# Patient Record
Sex: Female | Born: 1980 | Race: Black or African American | Hispanic: No | Marital: Single | Smoking: Former smoker
Health system: Southern US, Community
[De-identification: ages and names within clinical notes are randomized; demographics above are authoritative.]

## PROBLEM LIST (undated history)

## (undated) DIAGNOSIS — F32A Depression, unspecified: Secondary | ICD-10-CM

## (undated) DIAGNOSIS — M1990 Unspecified osteoarthritis, unspecified site: Secondary | ICD-10-CM

## (undated) DIAGNOSIS — I10 Essential (primary) hypertension: Secondary | ICD-10-CM

## (undated) DIAGNOSIS — F419 Anxiety disorder, unspecified: Secondary | ICD-10-CM

## (undated) HISTORY — DX: Unspecified osteoarthritis, unspecified site: M19.90

## (undated) HISTORY — DX: Depression, unspecified: F32.A

## (undated) HISTORY — DX: Anxiety disorder, unspecified: F41.9

## (undated) HISTORY — DX: Essential (primary) hypertension: I10

---

## 2010-09-19 ENCOUNTER — Emergency Department: Payer: Self-pay | Admitting: Internal Medicine

## 2010-11-18 ENCOUNTER — Inpatient Hospital Stay: Payer: Self-pay

## 2011-01-02 ENCOUNTER — Emergency Department: Payer: Self-pay | Admitting: Unknown Physician Specialty

## 2013-05-09 ENCOUNTER — Emergency Department: Payer: Self-pay | Admitting: Emergency Medicine

## 2013-05-09 LAB — CBC
HCT: 37.7 % (ref 35.0–47.0)
MCV: 88 fL (ref 80–100)
RBC: 4.27 10*6/uL (ref 3.80–5.20)
RDW: 12.8 % (ref 11.5–14.5)

## 2013-05-09 LAB — BASIC METABOLIC PANEL
Anion Gap: 5 — ABNORMAL LOW (ref 7–16)
BUN: 14 mg/dL (ref 7–18)
Chloride: 105 mmol/L (ref 98–107)
Co2: 26 mmol/L (ref 21–32)
Creatinine: 0.87 mg/dL (ref 0.60–1.30)
EGFR (African American): >60
Glucose: 103 mg/dL — ABNORMAL HIGH (ref 65–99)
Osmolality: 273 (ref 275–301)
Potassium: 3.4 mmol/L — ABNORMAL LOW (ref 3.5–5.1)
Sodium: 136 mmol/L (ref 136–145)

## 2013-05-09 LAB — TROPONIN I: Troponin-I: <0.02 ng/mL

## 2013-12-22 IMAGING — CT CT CHEST W/ CM
1 series · 15 of 32 positions shown, 19 images · IV contrast (APPLIED)
Comparison: none

REASON FOR EXAM: Chest Pain
COMMENTS:

PROCEDURE:     CT  - CT CHEST WITH CONTRAST  - May 09, 2013  [DATE]
RESULT:     History: Chest pain.
Comparison Study: No prior.
TECHNIQUE: Standard CT of the chest performed with 70 cc of [id number].
Evaluation in 3 dimensions on separate workstation performed.

[Series 4: soft tissue · axial · 0.65mm/px · z∈[-497,-239]mm · 15 of 97 slices shown, 19 images]
[im 7/97  soft-tissue]
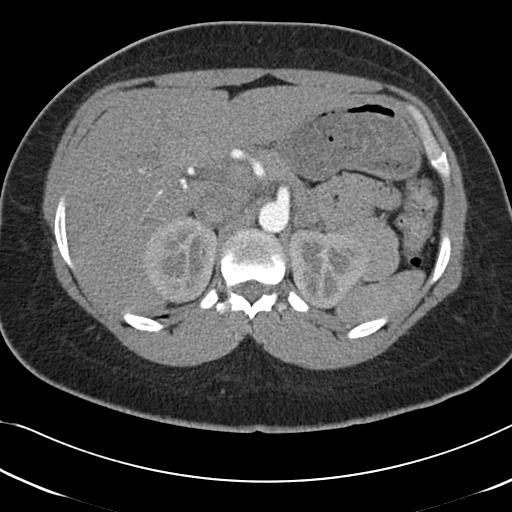
[im 7/97  bone]
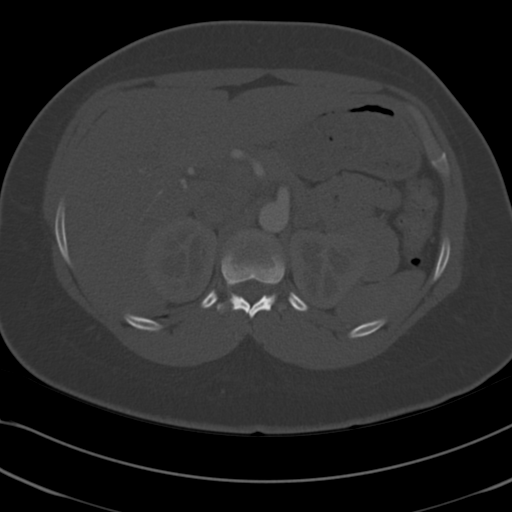
[im 13/97  soft-tissue]
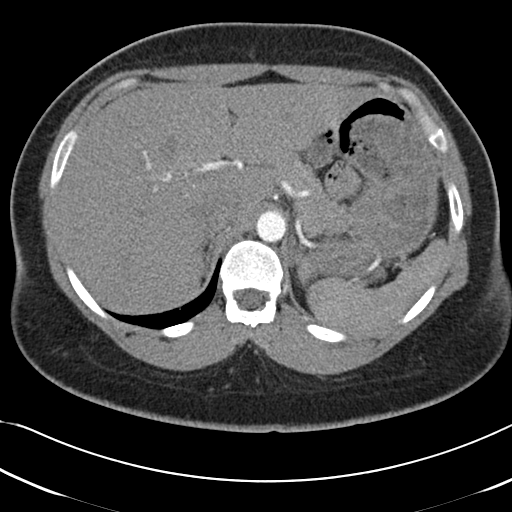
[im 19/97  soft-tissue]
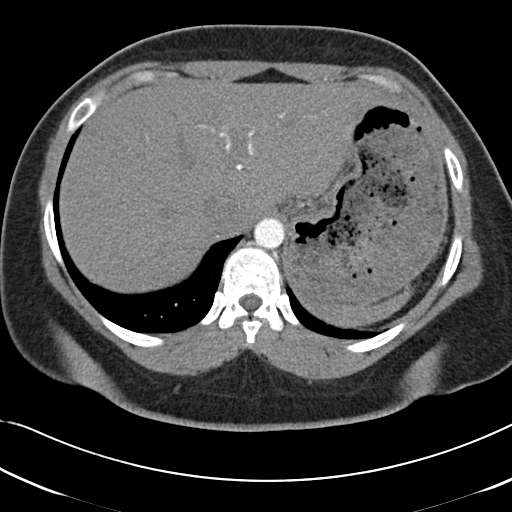
[im 28/97  soft-tissue]
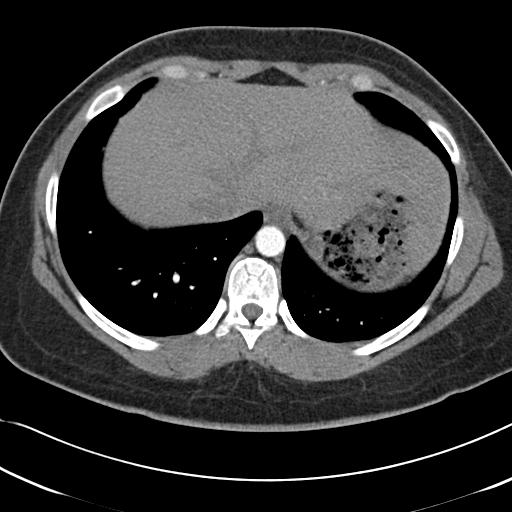
[im 35/97  soft-tissue]
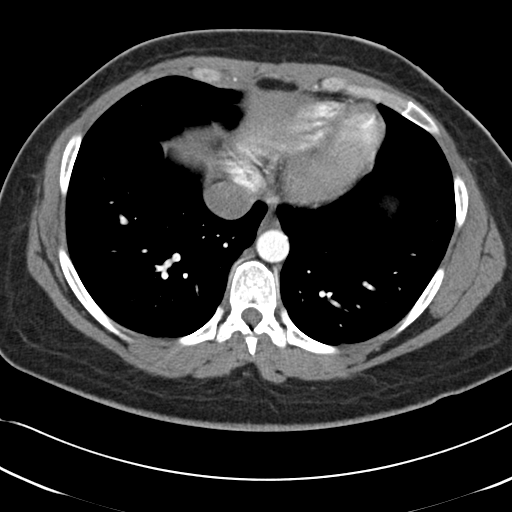
[im 41/97  soft-tissue]
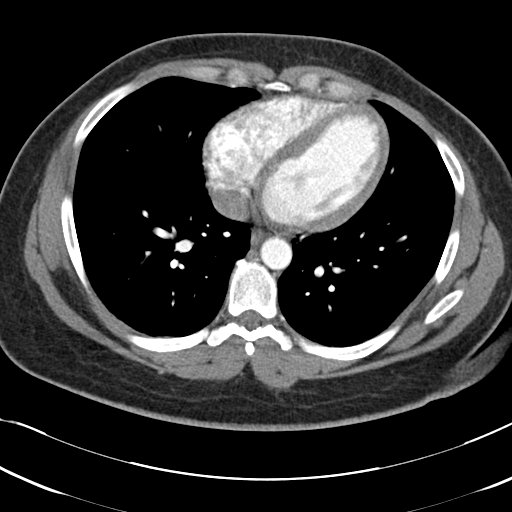
[im 50/97  soft-tissue]
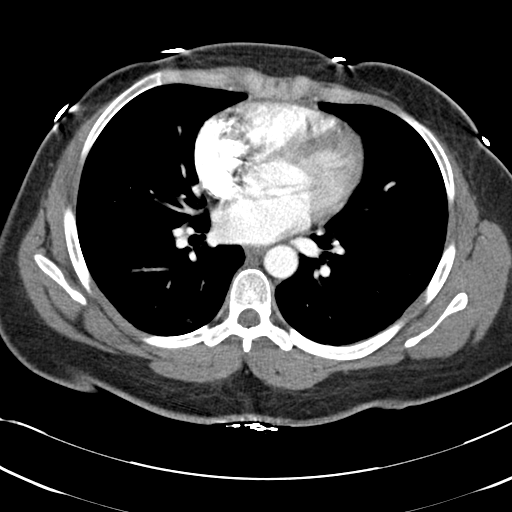
[im 56/97  soft-tissue]
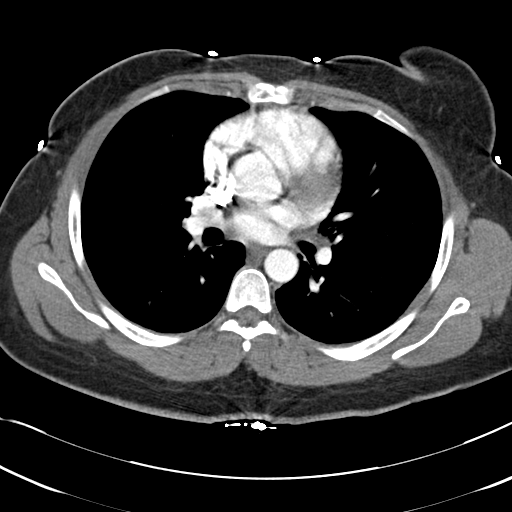
[im 62/97  soft-tissue]
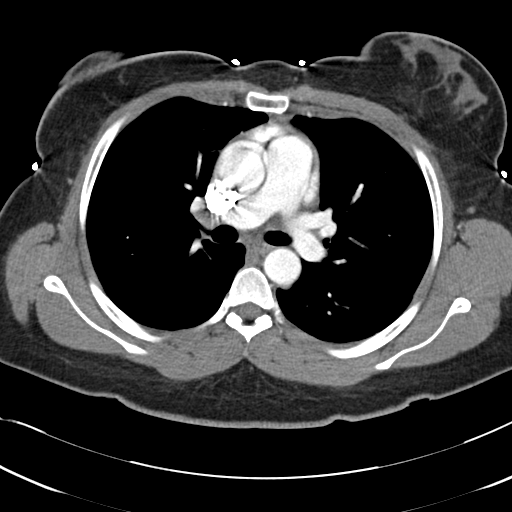
[im 62/97  bone]
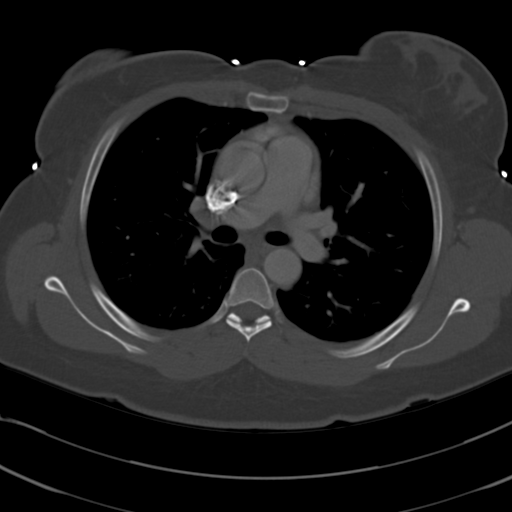
[im 69/97  soft-tissue]
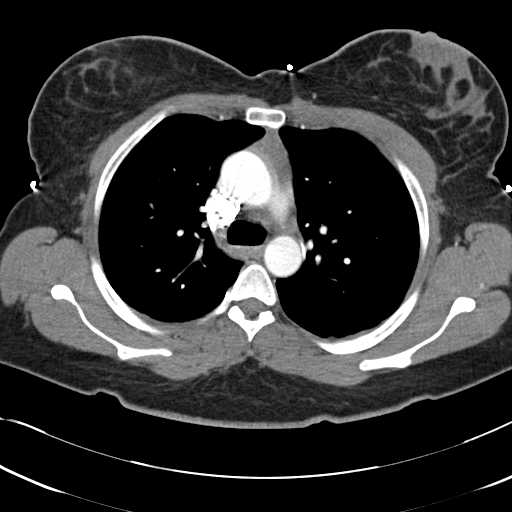
[im 78/97  soft-tissue]
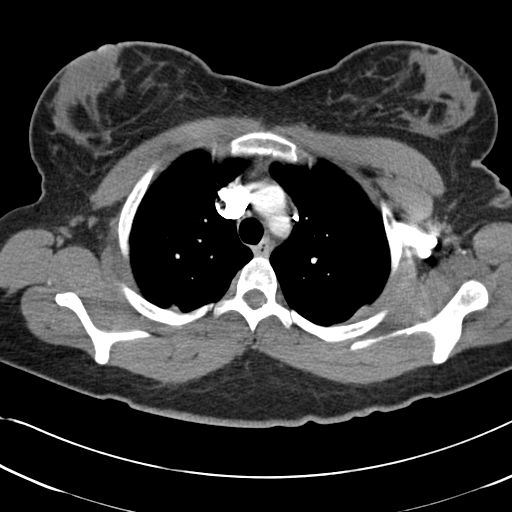
[im 84/97  soft-tissue]
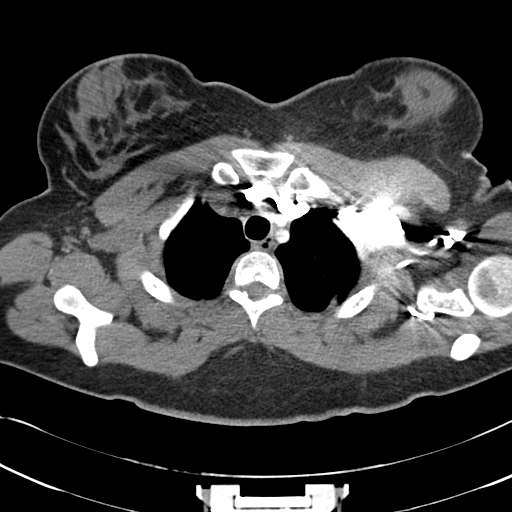
[im 84/97  lung]
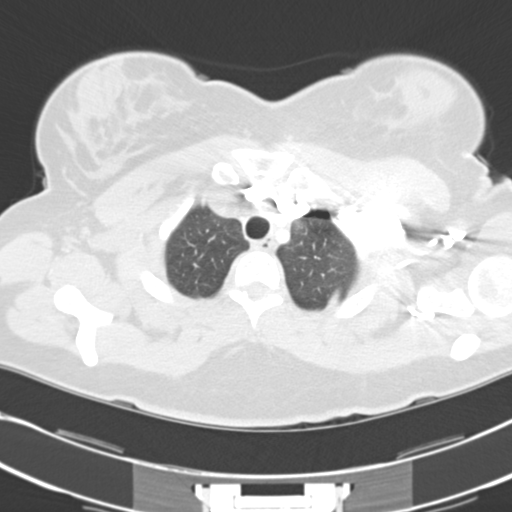
[im 87/97  lung]
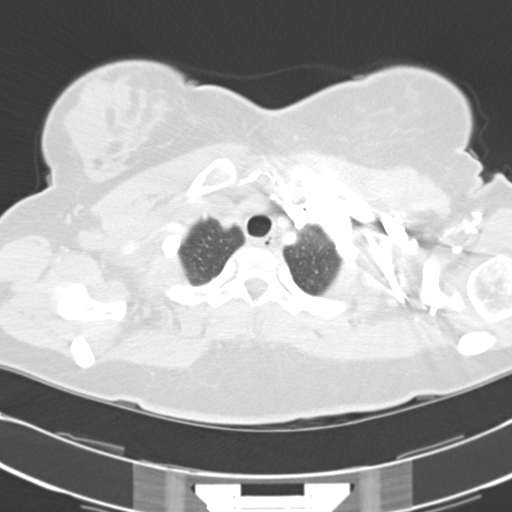
[im 90/97  soft-tissue]
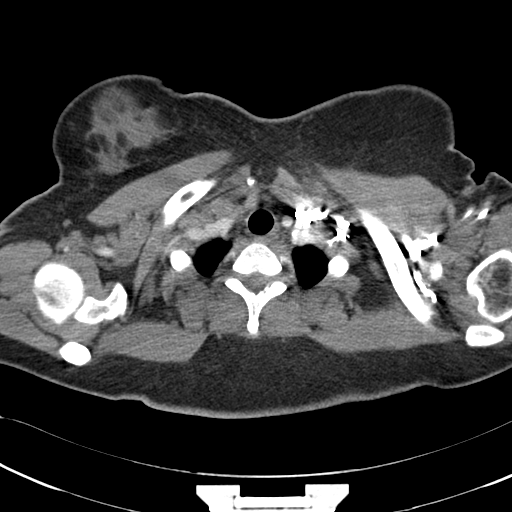
[im 90/97  lung]
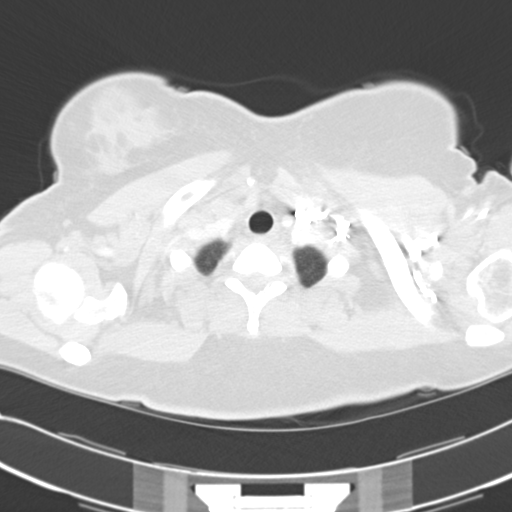
[im 93/97  lung]
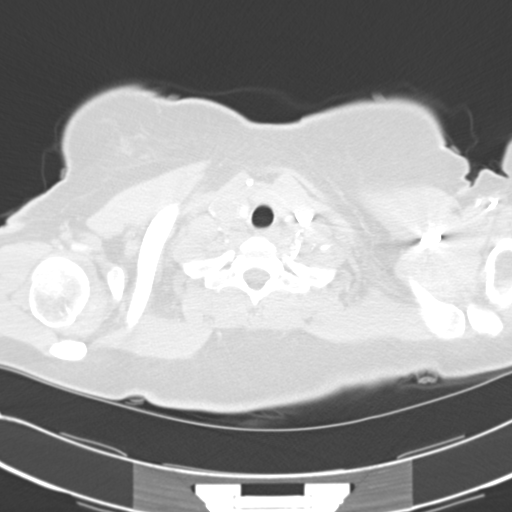

[15 of 32 positions shown; findings below may reference images not displayed]

FINDINGS: Mediastinum and hilar structures are normal. Thoracic aorta normal
caliber. Heart size normal. Pulmonary arteries normal. Large airways patent.
Multiple nonspecific pulmonary nodules are noted in both lungs, the largest
measures 5 mm and is in the right lower lobe (image 48 lung windows) .
Followup chest CTs will be needed to follow these nonspecific tiny nodules.
Large airways are patent. Adrenals are normal. No acute bony abnormality.

Noted in the right internal jugular vein is what appears to be thrombus.
This could be an normal flow defect however color flow duplex Doppler is
suggest for further evaluation. Report phoned to the emergency room at time
of study.
IMPRESSION: 1. Cannot rule out thrombus in the right internal jugular vein possibly
upper superior vena cava. This could represent flow defect. Color flow
duplex Doppler suggested for further evaluation.
2. Tiny nonspecific bilateral pulmonary nodules for which followup chest CT
a 6 month intervals for 2 years suggested.

## 2013-12-22 IMAGING — CR DG CHEST 2V
1 series · 2 of 2 positions shown · non-contrast
Comparison: none

REASON FOR EXAM: Chest Pain
COMMENTS:

PROCEDURE:     DXR - DXR CHEST PA (OR AP) AND LATERAL  - May 09, 2013  [DATE]
RESULT:     Comparison made to prior chest CT of 05/09/2013. Lungs are clear.
Heart size normal.

[Series 1: pa · 0.17mm/px · 2 of 2 slices shown]
[im 1/2]
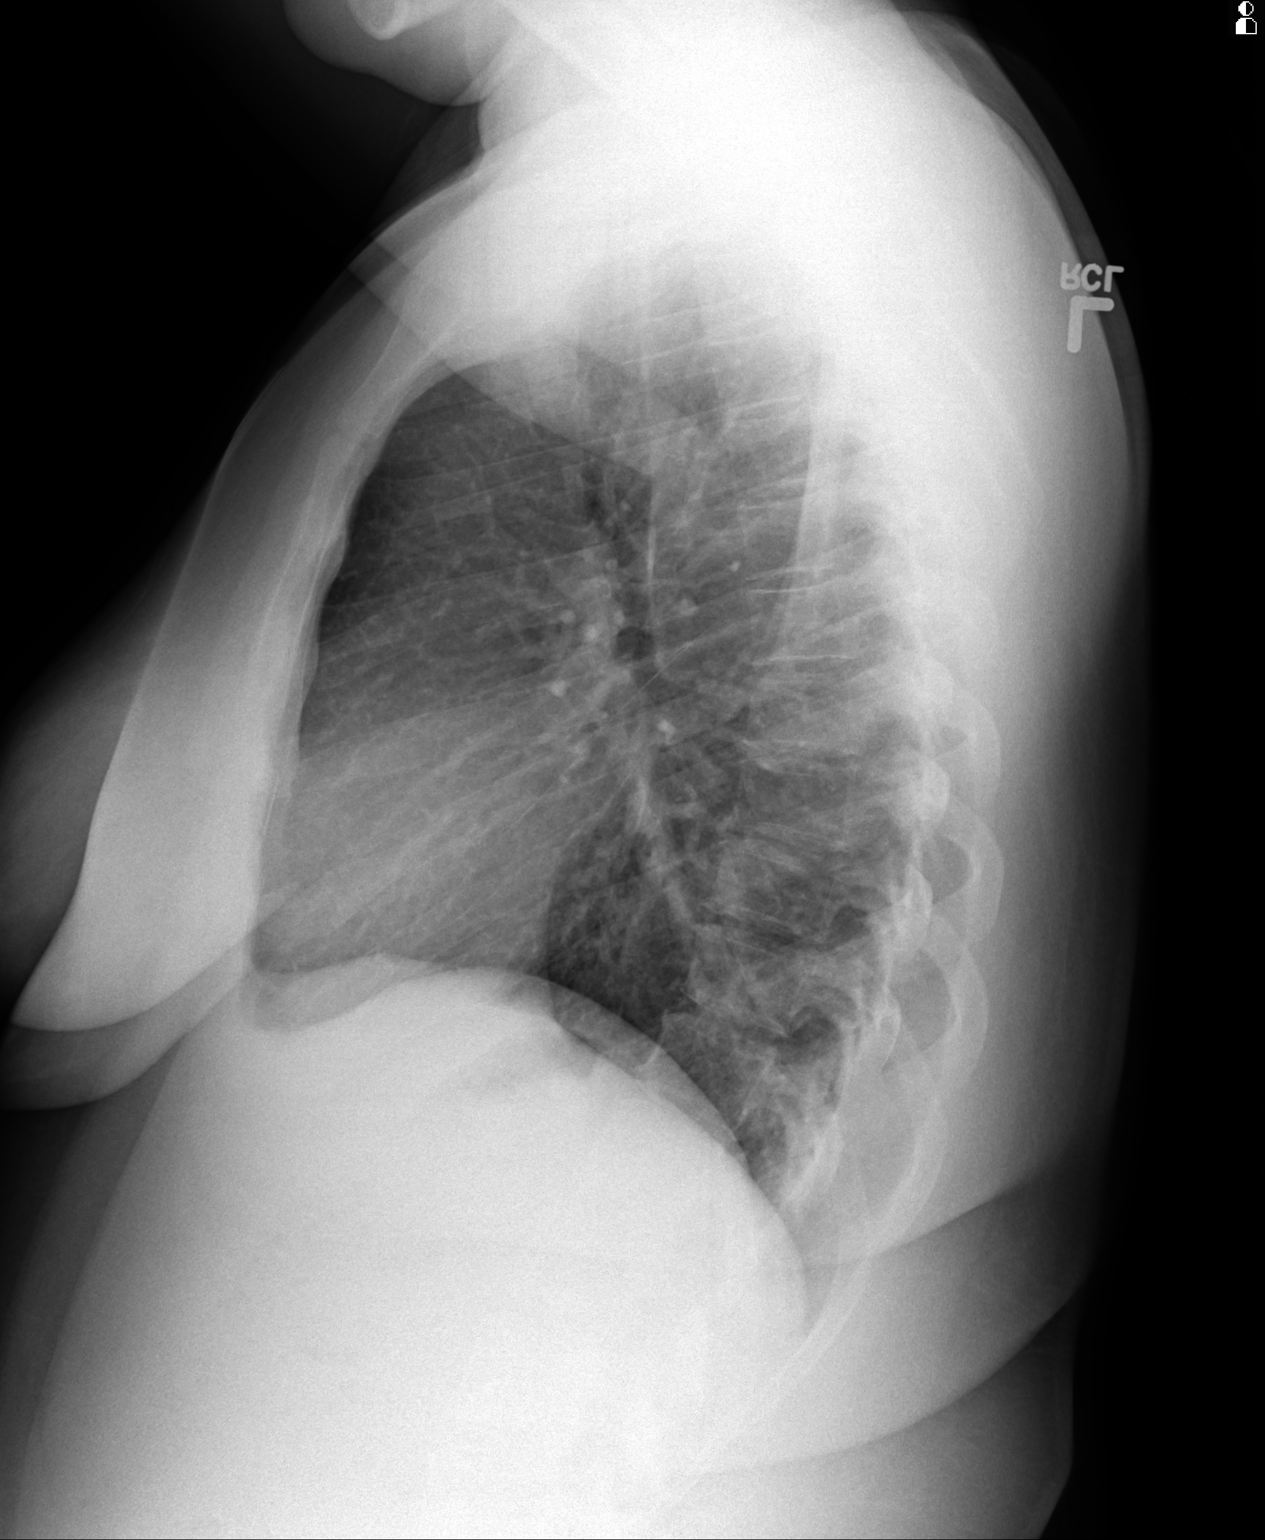
[im 2/2]
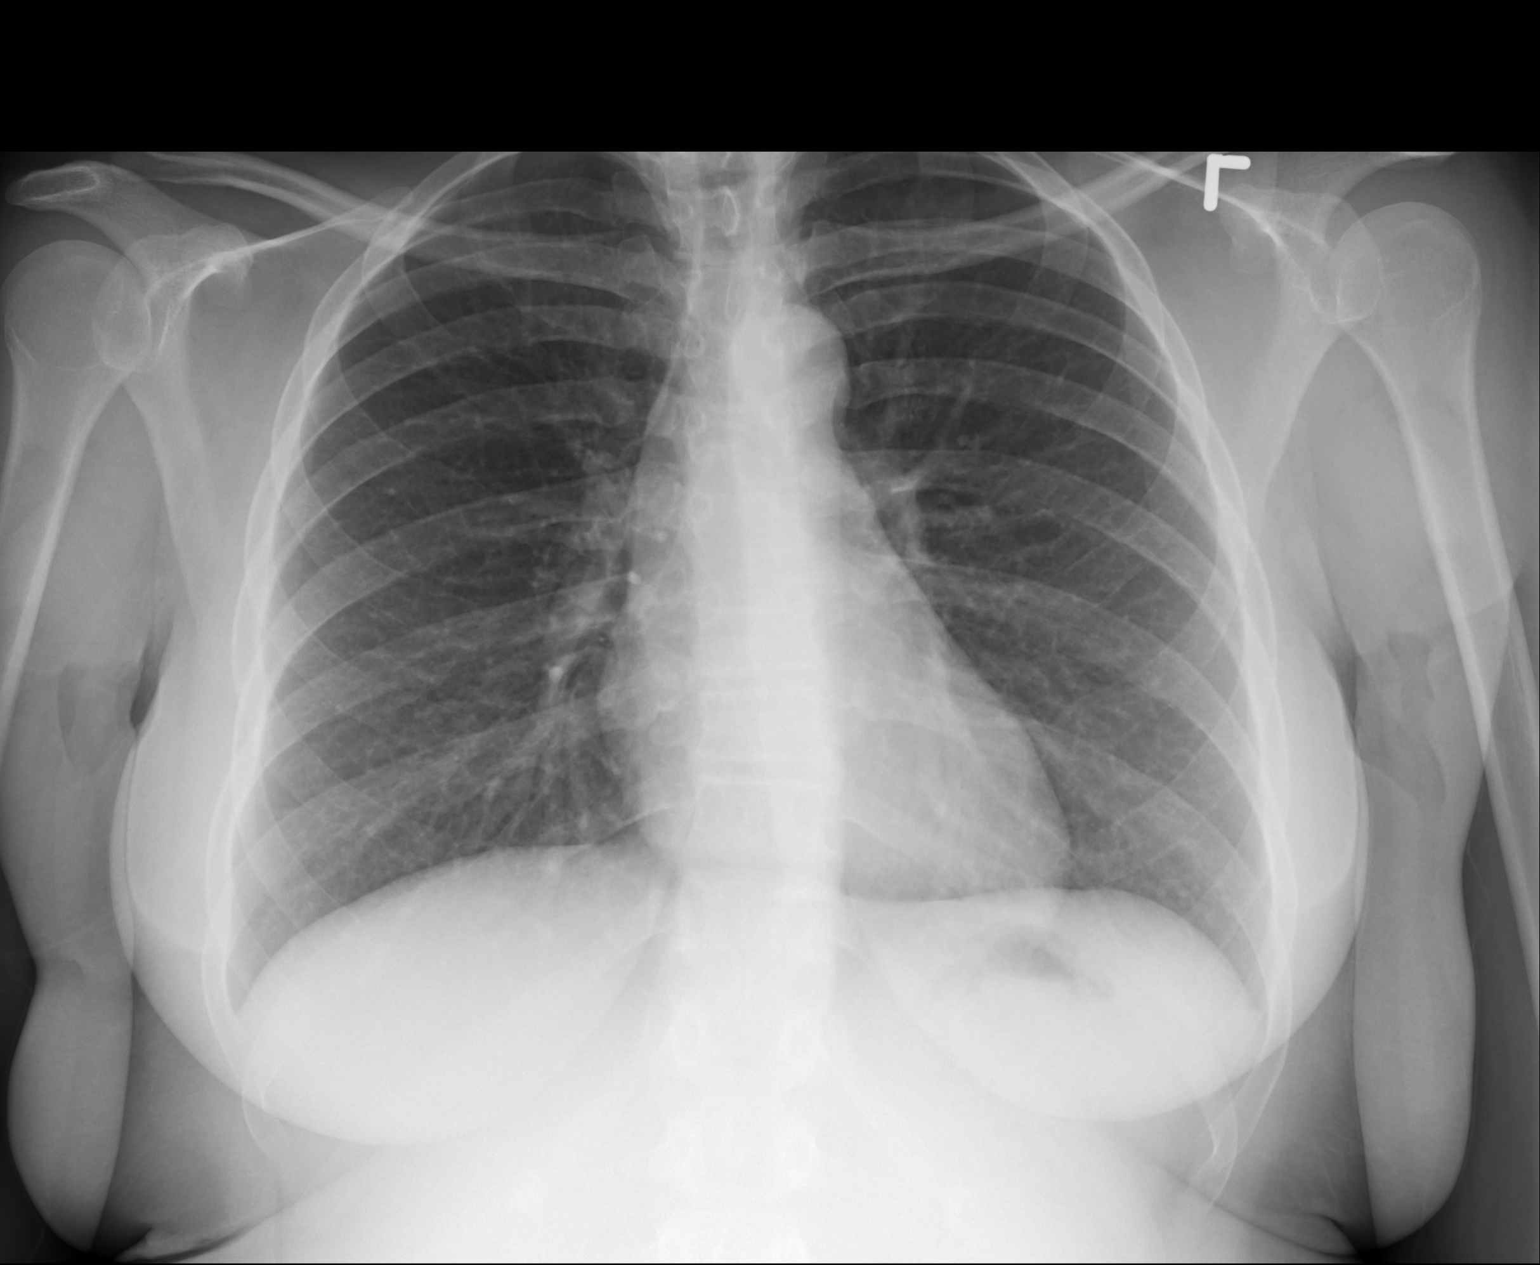

[2 of 2 positions shown; findings below may reference images not displayed]

IMPRESSION: No acute abnormality.

## 2014-10-13 NOTE — L&D Delivery Note (Addendum)
Delivery Summary for Kendra Patel  Labor Events:   Preterm labor:   Rupture date:   Rupture time:   Rupture type: Artificial  Fluid Color: Clear  Induction:   Augmentation:   Complications:   Cervical ripening:          Delivery:   Episiotomy:   Lacerations:   Repair suture:   Repair # of packets:   Blood loss (ml):    Information for the patient's newborn:  Chessie, Neuharth [[id number]]    Delivery 05/08/2015 4:10 PM by  VBAC, Vacuum Assisted Sex:  female Gestational Age: [redacted]w[redacted]d Delivery Clinician:  Hildred Laser Living?: Yes        APGARS  One minute Five minutes Ten minutes  Skin color: 1   1      Heart rate: 2   2      Grimace: 2   2      Muscle tone: 2   2      Breathing: 2   2      Totals: 9  9      Presentation/position: Vertex     Resuscitation:   Cord information:    Disposition of cord blood:     Blood gases sent?  Complications:   Placenta: Delivered: 05/08/2015 4:23 PM     appearance Newborn Measurements: Weight: 7 lb 15 oz (3600 g)  Height: 21.06"  Head circumference: 32 cm  Chest circumference: 32 cm  Other providers: Registered Nurse Transition RN Francisco Capuchin  Additional  information: Forceps:   Vacuum:   Breech:   Observed anomalies        Delivery Summary for Kendra Patel  Labor Events:   Preterm labor:   Rupture date:   Rupture time:   Rupture type: Artificial  Fluid Color: Clear  Induction:   Augmentation:   Complications:   Cervical ripening:          Delivery:   Episiotomy:   Lacerations:   Repair suture:   Repair # of packets:   Blood loss (ml): 200 ml   Information for the patient's newborn:  Hanifa, Antonetti [[id number]]    Delivery 05/08/2015 4:10 PM by  VBAC, Vacuum Assisted Sex:  female Gestational Age: [redacted]w[redacted]d Delivery Clinician:  Hildred Laser Living?: Yes        APGARS  One minute Five minutes Ten minutes  Skin color: 1   1      Heart rate: 2   2      Grimace: 2   2       Muscle tone: 2   2      Breathing: 2   2      Totals: 9  9      Presentation/position: Vertex     Resuscitation:   Cord information:    Disposition of cord blood:     Blood gases sent?  Complications:   Placenta: Delivered: 05/08/2015 4:23 PM     appearance Newborn Measurements: Weight: 7 lb 15 oz (3600 g)  Height: 21.06"  Head circumference: 32 cm  Chest circumference: 32 cm  Other providers: Registered Nurse Transition RN Francisco Capuchin  Additional  information: Forceps:   Vacuum:   Breech:   Observed anomalies        Delivery Note At 4:10 PM a viable female was delivered via VBAC, Vacuum Assisted (Presentation: cephalic ;LOA position).  APGAR: 9, 9; weight 7 lb 15 oz (3600  g).   Placenta status: intact , spontaneously delivered.  Cord:  with the following complications: none.  Cord pH: not performed  Anesthesia: Epidural  Episiotomy: None Lacerations:  None Suture Repair: None Est. Blood Loss (mL):  200 ml  Mom to postpartum.  Baby to Couplet care / Skin to Skin.  Hildred Laser 05/09/2015, 7:51 AM

## 2014-10-20 LAB — OB RESULTS CONSOLE RPR: RPR: NONREACTIVE

## 2014-10-20 LAB — OB RESULTS CONSOLE HIV ANTIBODY (ROUTINE TESTING): HIV: NONREACTIVE

## 2014-10-20 LAB — OB RESULTS CONSOLE VARICELLA ZOSTER ANTIBODY, IGG: Varicella: IMMUNE

## 2014-10-20 LAB — OB RESULTS CONSOLE ABO/RH: RH Type: POSITIVE

## 2014-10-20 LAB — OB RESULTS CONSOLE GC/CHLAMYDIA
Chlamydia: NEGATIVE
Gonorrhea: NEGATIVE

## 2014-10-20 LAB — OB RESULTS CONSOLE HGB/HCT, BLOOD
HEMATOCRIT: 50 %
Hemoglobin: 16.4 g/dL

## 2014-10-20 LAB — OB RESULTS CONSOLE ANTIBODY SCREEN: Antibody Screen: NEGATIVE

## 2014-10-20 LAB — OB RESULTS CONSOLE HEPATITIS B SURFACE ANTIGEN: Hepatitis B Surface Ag: NEGATIVE

## 2014-10-20 LAB — OB RESULTS CONSOLE RUBELLA ANTIBODY, IGM: Rubella: IMMUNE

## 2014-10-20 LAB — OB RESULTS CONSOLE TSH: TSH: 0.16

## 2015-01-19 ENCOUNTER — Emergency Department
Admit: 2015-01-19 | Disposition: A | Discharge status: Home / Self Care | Payer: Self-pay | Admitting: Emergency Medicine

## 2015-01-19 LAB — URINALYSIS, COMPLETE
BLOOD: NEGATIVE
Bilirubin,UR: NEGATIVE
Glucose,UR: NEGATIVE mg/dL (ref 0–75)
NITRITE: NEGATIVE
PH: 5 (ref 4.5–8.0)
PROTEIN: NEGATIVE
Specific Gravity: 1.025 (ref 1.003–1.030)

## 2015-01-19 LAB — WET PREP, GENITAL

## 2015-01-19 LAB — GC/CHLAMYDIA PROBE AMP

## 2015-03-14 ENCOUNTER — Encounter: Payer: Self-pay | Admitting: Obstetrics and Gynecology

## 2015-03-14 ENCOUNTER — Ambulatory Visit (INDEPENDENT_AMBULATORY_CARE_PROVIDER_SITE_OTHER): Payer: Medicaid Other | Admitting: Obstetrics and Gynecology

## 2015-03-14 VITALS — BP 108/72 | HR 94 | Wt 237.0 lb

## 2015-03-14 DIAGNOSIS — Z3493 Encounter for supervision of normal pregnancy, unspecified, third trimester: Secondary | ICD-10-CM

## 2015-03-14 DIAGNOSIS — M5430 Sciatica, unspecified side: Secondary | ICD-10-CM | POA: Insufficient documentation

## 2015-03-14 DIAGNOSIS — Z3483 Encounter for supervision of other normal pregnancy, third trimester: Secondary | ICD-10-CM

## 2015-03-14 DIAGNOSIS — R7989 Other specified abnormal findings of blood chemistry: Secondary | ICD-10-CM | POA: Insufficient documentation

## 2015-03-14 DIAGNOSIS — F1211 Cannabis abuse, in remission: Secondary | ICD-10-CM | POA: Insufficient documentation

## 2015-03-14 DIAGNOSIS — F1210 Cannabis abuse, uncomplicated: Secondary | ICD-10-CM

## 2015-03-14 DIAGNOSIS — O99210 Obesity complicating pregnancy, unspecified trimester: Secondary | ICD-10-CM | POA: Insufficient documentation

## 2015-03-14 DIAGNOSIS — O10019 Pre-existing essential hypertension complicating pregnancy, unspecified trimester: Secondary | ICD-10-CM

## 2015-03-14 DIAGNOSIS — O10919 Unspecified pre-existing hypertension complicating pregnancy, unspecified trimester: Secondary | ICD-10-CM | POA: Insufficient documentation

## 2015-03-14 DIAGNOSIS — Z8619 Personal history of other infectious and parasitic diseases: Secondary | ICD-10-CM | POA: Insufficient documentation

## 2015-03-14 DIAGNOSIS — Z720 Tobacco use: Secondary | ICD-10-CM | POA: Insufficient documentation

## 2015-03-14 DIAGNOSIS — Z6837 Body mass index (BMI) 37.0-37.9, adult: Secondary | ICD-10-CM | POA: Insufficient documentation

## 2015-03-14 DIAGNOSIS — F329 Major depressive disorder, single episode, unspecified: Secondary | ICD-10-CM | POA: Insufficient documentation

## 2015-03-14 DIAGNOSIS — F419 Anxiety disorder, unspecified: Secondary | ICD-10-CM

## 2015-03-14 LAB — POCT URINALYSIS DIPSTICK
Bilirubin, UA: NEGATIVE
Glucose, UA: NEGATIVE
Ketones, UA: NEGATIVE
Leukocytes, UA: NEGATIVE
NITRITE UA: NEGATIVE
Protein, UA: NEGATIVE
SPEC GRAV UA: 1.015
UROBILINOGEN UA: 0.2
pH, UA: 7

## 2015-03-14 NOTE — Patient Instructions (Addendum)
RTC in 2 weeks for routine OB visit.  Fetal kick counts twice daily.  Preterm labor precautions.   RTC in 2 weeks for routine OB visit.  Fetal kick counts twice daily.  Preterm labor precautions.

## 2015-03-14 NOTE — Progress Notes (Signed)
ROB: Patient complains of left leg pain radiating from buttocks.  Likely round ligament pain. Advised on stretching, Tylenol prn. Cautioned again on excessive weight gain in pregnancy. Discussed dietary modification and exercise in pregnancy.  RTC in 2 weeks.

## 2015-03-21 LAB — OB RESULTS CONSOLE GC/CHLAMYDIA
Chlamydia: NEGATIVE
Gonorrhea: NEGATIVE

## 2015-03-21 LAB — OB RESULTS CONSOLE GBS: STREP GROUP B AG: NEGATIVE

## 2015-03-28 ENCOUNTER — Ambulatory Visit (INDEPENDENT_AMBULATORY_CARE_PROVIDER_SITE_OTHER): Payer: Medicaid Other | Admitting: Obstetrics and Gynecology

## 2015-03-28 ENCOUNTER — Encounter: Payer: Self-pay | Admitting: Obstetrics and Gynecology

## 2015-03-28 VITALS — BP 117/71 | HR 94 | Ht 66.0 in | Wt 237.4 lb

## 2015-03-28 DIAGNOSIS — Z3403 Encounter for supervision of normal first pregnancy, third trimester: Secondary | ICD-10-CM

## 2015-03-28 LAB — POCT URINALYSIS DIPSTICK
Bilirubin, UA: NEGATIVE
GLUCOSE UA: NEGATIVE
KETONES UA: NEGATIVE
Leukocytes, UA: NEGATIVE
Nitrite, UA: NEGATIVE
PROTEIN UA: NEGATIVE
Spec Grav, UA: 1.02
UROBILINOGEN UA: 0.2
pH, UA: 6

## 2015-03-28 NOTE — Patient Instructions (Signed)
Labor precautions reviewed.  Call office or follow up at Labor and Delivery for the following: 1) Contractions less than 10 min apart for longer than 1 hour 2) Rupture of membranes 3) Vaginal bleeding requiring a pad 4) Decreased fetal movement 

## 2015-03-28 NOTE — Progress Notes (Signed)
ROB: Notes sciatica has improved. Denies complaints. Cautioned again on excessive weight gain in pregnancy. Reiterated dietary modification and exercise in pregnancy.  Has decreased to 1 cig daily. RTC in 2 weeks. For 36 week labs at that time.

## 2015-04-11 ENCOUNTER — Encounter: Payer: Self-pay | Admitting: Obstetrics and Gynecology

## 2015-04-11 ENCOUNTER — Ambulatory Visit (INDEPENDENT_AMBULATORY_CARE_PROVIDER_SITE_OTHER): Payer: Medicaid Other | Admitting: Obstetrics and Gynecology

## 2015-04-11 VITALS — BP 110/76 | HR 98 | Wt 242.6 lb

## 2015-04-11 DIAGNOSIS — Z3483 Encounter for supervision of other normal pregnancy, third trimester: Secondary | ICD-10-CM

## 2015-04-11 DIAGNOSIS — Z3493 Encounter for supervision of normal pregnancy, unspecified, third trimester: Secondary | ICD-10-CM

## 2015-04-11 DIAGNOSIS — O99210 Obesity complicating pregnancy, unspecified trimester: Secondary | ICD-10-CM

## 2015-04-11 DIAGNOSIS — O09219 Supervision of pregnancy with history of pre-term labor, unspecified trimester: Secondary | ICD-10-CM

## 2015-04-11 DIAGNOSIS — Z36 Encounter for antenatal screening of mother: Secondary | ICD-10-CM

## 2015-04-11 DIAGNOSIS — O09213 Supervision of pregnancy with history of pre-term labor, third trimester: Secondary | ICD-10-CM

## 2015-04-11 DIAGNOSIS — Z369 Encounter for antenatal screening, unspecified: Secondary | ICD-10-CM

## 2015-04-11 DIAGNOSIS — O09899 Supervision of other high risk pregnancies, unspecified trimester: Secondary | ICD-10-CM | POA: Insufficient documentation

## 2015-04-11 DIAGNOSIS — O09893 Supervision of other high risk pregnancies, third trimester: Secondary | ICD-10-CM

## 2015-04-11 LAB — POCT URINALYSIS DIPSTICK
Bilirubin, UA: NEGATIVE
Glucose, UA: NEGATIVE
KETONES UA: NEGATIVE
LEUKOCYTES UA: NEGATIVE
Nitrite, UA: NEGATIVE
PROTEIN UA: NEGATIVE
SPEC GRAV UA: 1.015
Urobilinogen, UA: 0.2
pH, UA: 7

## 2015-04-11 NOTE — Progress Notes (Signed)
Paitent reports ankle swelling and joint pain in hands and feet.  Denies headaches, vision changes, abdominal pain.  Advised on elevation of feet, resting when possible.  Desires letter for work as last day is July 1st.  Letter provided.  36 week labs done.  RTC in 1 week.  Labor precautions. Continued to caution on excessive weight gain in pregnancy.

## 2015-04-11 NOTE — Patient Instructions (Signed)
Labor precautions reviewed.  Call office or follow up at Labor and Delivery for the following: 1) Contractions less than 10 min apart for longer than 1 hour 2) Rupture of membranes 3) Vaginal bleeding requiring a pad 4) Decreased fetal movement 

## 2015-04-13 LAB — GC/CHLAMYDIA PROBE AMP
Chlamydia trachomatis, NAA: NEGATIVE
Neisseria gonorrhoeae by PCR: NEGATIVE

## 2015-04-15 LAB — CULTURE, BETA STREP (GROUP B ONLY): Strep Gp B Culture: NEGATIVE

## 2015-04-18 ENCOUNTER — Ambulatory Visit (INDEPENDENT_AMBULATORY_CARE_PROVIDER_SITE_OTHER): Payer: Medicaid Other | Admitting: Obstetrics and Gynecology

## 2015-04-18 VITALS — BP 110/76 | HR 93 | Wt 242.3 lb

## 2015-04-18 DIAGNOSIS — O10019 Pre-existing essential hypertension complicating pregnancy, unspecified trimester: Secondary | ICD-10-CM

## 2015-04-18 DIAGNOSIS — Z331 Pregnant state, incidental: Secondary | ICD-10-CM

## 2015-04-18 DIAGNOSIS — Z1389 Encounter for screening for other disorder: Secondary | ICD-10-CM

## 2015-04-18 DIAGNOSIS — O10919 Unspecified pre-existing hypertension complicating pregnancy, unspecified trimester: Secondary | ICD-10-CM

## 2015-04-18 LAB — POCT URINALYSIS DIPSTICK
BILIRUBIN UA: NEGATIVE
Blood, UA: NEGATIVE
GLUCOSE UA: NEGATIVE
Ketones, UA: NEGATIVE
Leukocytes, UA: NEGATIVE
Nitrite, UA: NEGATIVE
PROTEIN UA: NEGATIVE
SPEC GRAV UA: 1.025
Urobilinogen, UA: 0.2
pH, UA: 6

## 2015-04-18 NOTE — Progress Notes (Signed)
ZIKA EXPOSURE SCREEN:  The patient has not traveled to a Zika Virus endemic area within the past 6 months, nor has she had unprotected sex with a partner who has travelled to a Zika endemic region within the past 6 months. The patient has been advised to notify us if these factors change any time during this current pregnancy, so adequate testing and monitoring can be initiated.  

## 2015-04-18 NOTE — Progress Notes (Signed)
ROB: Patient denies complaints today.  Has stopped working.  Notes symptoms have improved some from last visit.  Labor preautions given.  36 week labs neg.  RTC in 1 week.

## 2015-04-26 ENCOUNTER — Encounter: Payer: Self-pay | Admitting: Obstetrics and Gynecology

## 2015-04-26 ENCOUNTER — Ambulatory Visit (INDEPENDENT_AMBULATORY_CARE_PROVIDER_SITE_OTHER): Payer: Medicaid Other | Admitting: Obstetrics and Gynecology

## 2015-04-26 VITALS — BP 93/64 | HR 90 | Wt 243.4 lb

## 2015-04-26 DIAGNOSIS — Z3493 Encounter for supervision of normal pregnancy, unspecified, third trimester: Secondary | ICD-10-CM

## 2015-04-26 DIAGNOSIS — O09213 Supervision of pregnancy with history of pre-term labor, third trimester: Secondary | ICD-10-CM

## 2015-04-26 DIAGNOSIS — O10919 Unspecified pre-existing hypertension complicating pregnancy, unspecified trimester: Secondary | ICD-10-CM

## 2015-04-26 DIAGNOSIS — O10019 Pre-existing essential hypertension complicating pregnancy, unspecified trimester: Secondary | ICD-10-CM

## 2015-04-26 DIAGNOSIS — O99210 Obesity complicating pregnancy, unspecified trimester: Secondary | ICD-10-CM

## 2015-04-26 DIAGNOSIS — O09893 Supervision of other high risk pregnancies, third trimester: Secondary | ICD-10-CM

## 2015-04-26 DIAGNOSIS — Z3483 Encounter for supervision of other normal pregnancy, third trimester: Secondary | ICD-10-CM

## 2015-04-26 LAB — POCT URINALYSIS DIPSTICK
Bilirubin, UA: NEGATIVE
Glucose, UA: NEGATIVE
KETONES UA: 15
LEUKOCYTES UA: NEGATIVE
Nitrite, UA: NEGATIVE
PROTEIN UA: NEGATIVE
Spec Grav, UA: 1.025
UROBILINOGEN UA: NEGATIVE
pH, UA: 6

## 2015-04-27 NOTE — Progress Notes (Signed)
ROB: Patient denies complaints. Cervix unchanged today from prior exam. Labor precautions given. RTC in 1 week.

## 2015-05-04 ENCOUNTER — Ambulatory Visit (INDEPENDENT_AMBULATORY_CARE_PROVIDER_SITE_OTHER): Payer: Medicaid Other | Admitting: Obstetrics and Gynecology

## 2015-05-04 ENCOUNTER — Encounter: Payer: Self-pay | Admitting: Obstetrics and Gynecology

## 2015-05-04 VITALS — BP 105/75 | HR 94 | Wt 246.9 lb

## 2015-05-04 DIAGNOSIS — Z3483 Encounter for supervision of other normal pregnancy, third trimester: Secondary | ICD-10-CM

## 2015-05-04 DIAGNOSIS — O99210 Obesity complicating pregnancy, unspecified trimester: Secondary | ICD-10-CM

## 2015-05-04 DIAGNOSIS — O10919 Unspecified pre-existing hypertension complicating pregnancy, unspecified trimester: Secondary | ICD-10-CM

## 2015-05-04 DIAGNOSIS — O10019 Pre-existing essential hypertension complicating pregnancy, unspecified trimester: Secondary | ICD-10-CM

## 2015-05-04 DIAGNOSIS — Z3493 Encounter for supervision of normal pregnancy, unspecified, third trimester: Secondary | ICD-10-CM

## 2015-05-04 LAB — POCT URINALYSIS DIPSTICK
Bilirubin, UA: NEGATIVE
Glucose, UA: NEGATIVE
Ketones, UA: NEGATIVE
Leukocytes, UA: NEGATIVE
Nitrite, UA: NEGATIVE
Spec Grav, UA: 1.02
Urobilinogen, UA: NEGATIVE
pH, UA: 6

## 2015-05-04 NOTE — Progress Notes (Signed)
ROB: Patient denies complaints except for mild fatigue. Cervix essentially unchanged today from prior exam. Labor precautions and fetal kick count instructions given. RTC in 5 days.  For NST at that visit for postdates.  Scheduled for IOL on 05/09/2015.

## 2015-05-08 ENCOUNTER — Inpatient Hospital Stay: Payer: Medicaid Other | Admitting: Certified Registered"

## 2015-05-08 ENCOUNTER — Inpatient Hospital Stay
Admission: EM | Admit: 2015-05-08 | Discharge: 2015-05-10 | DRG: 767 | Disposition: A | Discharge status: Home / Self Care | Payer: Medicaid Other | Attending: Obstetrics and Gynecology | Admitting: Obstetrics and Gynecology

## 2015-05-08 ENCOUNTER — Encounter: Payer: Medicaid Other | Admitting: Obstetrics and Gynecology

## 2015-05-08 ENCOUNTER — Encounter: Payer: Self-pay | Admitting: *Deleted

## 2015-05-08 DIAGNOSIS — O480 Post-term pregnancy: Secondary | ICD-10-CM | POA: Diagnosis present

## 2015-05-08 DIAGNOSIS — Z302 Encounter for sterilization: Secondary | ICD-10-CM

## 2015-05-08 DIAGNOSIS — Z3A40 40 weeks gestation of pregnancy: Secondary | ICD-10-CM | POA: Diagnosis present

## 2015-05-08 DIAGNOSIS — F418 Other specified anxiety disorders: Secondary | ICD-10-CM | POA: Diagnosis present

## 2015-05-08 DIAGNOSIS — O99214 Obesity complicating childbirth: Secondary | ICD-10-CM | POA: Diagnosis present

## 2015-05-08 DIAGNOSIS — O99344 Other mental disorders complicating childbirth: Secondary | ICD-10-CM | POA: Diagnosis present

## 2015-05-08 DIAGNOSIS — Z9851 Tubal ligation status: Secondary | ICD-10-CM

## 2015-05-08 DIAGNOSIS — O99324 Drug use complicating childbirth: Secondary | ICD-10-CM | POA: Diagnosis present

## 2015-05-08 DIAGNOSIS — F1290 Cannabis use, unspecified, uncomplicated: Secondary | ICD-10-CM | POA: Diagnosis present

## 2015-05-08 DIAGNOSIS — O1092 Unspecified pre-existing hypertension complicating childbirth: Secondary | ICD-10-CM | POA: Diagnosis present

## 2015-05-08 DIAGNOSIS — O99334 Smoking (tobacco) complicating childbirth: Secondary | ICD-10-CM | POA: Diagnosis present

## 2015-05-08 DIAGNOSIS — Z3483 Encounter for supervision of other normal pregnancy, third trimester: Secondary | ICD-10-CM

## 2015-05-08 LAB — CBC
HCT: 38.7 % (ref 35.0–47.0)
Hemoglobin: 12.8 g/dL (ref 12.0–16.0)
MCH: 30.6 pg (ref 26.0–34.0)
MCHC: 33.1 g/dL (ref 32.0–36.0)
MCV: 92.5 fL (ref 80.0–100.0)
Platelets: 184 10*3/uL (ref 150–440)
RBC: 4.19 MIL/uL (ref 3.80–5.20)
RDW: 13.3 % (ref 11.5–14.5)
WBC: 9.8 10*3/uL (ref 3.6–11.0)

## 2015-05-08 LAB — TYPE AND SCREEN
ABO/RH(D): O POS
ANTIBODY SCREEN: NEGATIVE

## 2015-05-08 LAB — URINE DRUG SCREEN, QUALITATIVE (ARMC ONLY)
AMPHETAMINES, UR SCREEN: NOT DETECTED
Barbiturates, Ur Screen: NOT DETECTED
Benzodiazepine, Ur Scrn: NOT DETECTED
COCAINE METABOLITE, UR ~~LOC~~: NOT DETECTED
Cannabinoid 50 Ng, Ur ~~LOC~~: POSITIVE — AB
MDMA (Ecstasy)Ur Screen: NOT DETECTED
Methadone Scn, Ur: NOT DETECTED
OPIATE, UR SCREEN: NOT DETECTED
Phencyclidine (PCP) Ur S: NOT DETECTED
TRICYCLIC, UR SCREEN: NOT DETECTED

## 2015-05-08 LAB — ABO/RH: ABO/RH(D): O POS

## 2015-05-08 MED ORDER — LACTATED RINGERS IV SOLN
INTRAVENOUS | Status: DC
  Administered 2015-05-08: 09:00:00 via INTRAVENOUS

## 2015-05-08 MED ORDER — DIBUCAINE 1 % RE OINT: 1.0000 "application " | TOPICAL_OINTMENT | RECTAL | Status: DC | PRN

## 2015-05-08 MED ORDER — LIDOCAINE HCL (PF) 1 % IJ SOLN
INTRAMUSCULAR | Status: AC
  Filled 2015-05-08: qty 30

## 2015-05-08 MED ORDER — LIDOCAINE-EPINEPHRINE (PF) 1.5 %-1:200000 IJ SOLN
INTRAMUSCULAR | Status: DC | PRN
  Administered 2015-05-08: 3 mL via EPIDURAL

## 2015-05-08 MED ORDER — METOCLOPRAMIDE HCL 10 MG PO TABS
10.0000 mg | ORAL_TABLET | Freq: Once | ORAL | Status: AC
  Administered 2015-05-09: 10 mg via ORAL
  Filled 2015-05-08: qty 1

## 2015-05-08 MED ORDER — BUTORPHANOL TARTRATE 1 MG/ML IJ SOLN
1.0000 mg | INTRAMUSCULAR | Status: DC | PRN
  Administered 2015-05-08 (×3): 1 mg via INTRAVENOUS
  Filled 2015-05-08: qty 1
  Filled 2015-05-08: qty 2
  Filled 2015-05-08: qty 1

## 2015-05-08 MED ORDER — DIPHENHYDRAMINE HCL 25 MG PO CAPS: 25.0000 mg | ORAL_CAPSULE | Freq: Four times a day (QID) | ORAL | Status: DC | PRN

## 2015-05-08 MED ORDER — EPHEDRINE 5 MG/ML INJ
10.0000 mg | INTRAVENOUS | Status: DC | PRN
  Filled 2015-05-08: qty 2

## 2015-05-08 MED ORDER — ONDANSETRON HCL 4 MG/2ML IJ SOLN: 4.0000 mg | INTRAMUSCULAR | Status: DC | PRN

## 2015-05-08 MED ORDER — CITRIC ACID-SODIUM CITRATE 334-500 MG/5ML PO SOLN: 30.0000 mL | ORAL | Status: DC | PRN

## 2015-05-08 MED ORDER — FENTANYL 2.5 MCG/ML W/ROPIVACAINE 0.2% IN NS 100 ML EPIDURAL INFUSION (ARMC-ANES): 9.0000 mL/h | EPIDURAL | Status: DC

## 2015-05-08 MED ORDER — LACTATED RINGERS IV SOLN
500.0000 mL | INTRAVENOUS | Status: DC | PRN
Start: 2015-05-08 — End: 2015-05-08
  Administered 2015-05-08 (×2): 500 mL via INTRAVENOUS

## 2015-05-08 MED ORDER — AMMONIA AROMATIC IN INHA
RESPIRATORY_TRACT | Status: AC
  Filled 2015-05-08: qty 10

## 2015-05-08 MED ORDER — DIPHENHYDRAMINE HCL 50 MG/ML IJ SOLN: 12.5000 mg | INTRAMUSCULAR | Status: DC | PRN

## 2015-05-08 MED ORDER — MISOPROSTOL 200 MCG PO TABS
ORAL_TABLET | ORAL | Status: AC
  Filled 2015-05-08: qty 4

## 2015-05-08 MED ORDER — ZOLPIDEM TARTRATE 5 MG PO TABS: 5.0000 mg | ORAL_TABLET | Freq: Every evening | ORAL | Status: DC | PRN

## 2015-05-08 MED ORDER — FENTANYL 2.5 MCG/ML W/ROPIVACAINE 0.2% IN NS 100 ML EPIDURAL INFUSION (ARMC-ANES)
EPIDURAL | Status: AC
  Administered 2015-05-08: 9 mL/h via EPIDURAL
  Filled 2015-05-08: qty 100

## 2015-05-08 MED ORDER — SIMETHICONE 80 MG PO CHEW: 80.0000 mg | CHEWABLE_TABLET | ORAL | Status: DC | PRN

## 2015-05-08 MED ORDER — IBUPROFEN 600 MG PO TABS
ORAL_TABLET | ORAL | Status: AC
  Administered 2015-05-08: 20:00:00 via ORAL
  Filled 2015-05-08: qty 1

## 2015-05-08 MED ORDER — LACTATED RINGERS IV SOLN
INTRAVENOUS | Status: DC
  Administered 2015-05-09: 50 mL/h via INTRAVENOUS

## 2015-05-08 MED ORDER — BENZOCAINE-MENTHOL 20-0.5 % EX AERO: 1.0000 "application " | INHALATION_SPRAY | CUTANEOUS | Status: DC | PRN

## 2015-05-08 MED ORDER — TERBUTALINE SULFATE 1 MG/ML IJ SOLN
0.2500 mg | Freq: Once | INTRAMUSCULAR | Status: DC | PRN
  Filled 2015-05-08: qty 1

## 2015-05-08 MED ORDER — BUPIVACAINE HCL (PF) 0.25 % IJ SOLN
INTRAMUSCULAR | Status: DC | PRN
  Administered 2015-05-08 (×2): 5 mL

## 2015-05-08 MED ORDER — OXYTOCIN 40 UNITS IN LACTATED RINGERS INFUSION - SIMPLE MED
62.5000 mL/h | INTRAVENOUS | Status: DC
  Administered 2015-05-08: 999 mL/h via INTRAVENOUS
  Filled 2015-05-08: qty 1000

## 2015-05-08 MED ORDER — PHENYLEPHRINE 40 MCG/ML (10ML) SYRINGE FOR IV PUSH (FOR BLOOD PRESSURE SUPPORT)
80.0000 ug | PREFILLED_SYRINGE | INTRAVENOUS | Status: DC | PRN
  Filled 2015-05-08: qty 2

## 2015-05-08 MED ORDER — TETANUS-DIPHTH-ACELL PERTUSSIS 5-2.5-18.5 LF-MCG/0.5 IM SUSP: 0.5000 mL | Freq: Once | INTRAMUSCULAR | Status: DC

## 2015-05-08 MED ORDER — OXYTOCIN BOLUS FROM INFUSION: 500.0000 mL | INTRAVENOUS | Status: DC

## 2015-05-08 MED ORDER — LANOLIN HYDROUS EX OINT: TOPICAL_OINTMENT | CUTANEOUS | Status: DC | PRN

## 2015-05-08 MED ORDER — WITCH HAZEL-GLYCERIN EX PADS: 1.0000 "application " | MEDICATED_PAD | CUTANEOUS | Status: DC | PRN

## 2015-05-08 MED ORDER — ACETAMINOPHEN 325 MG PO TABS: 650.0000 mg | ORAL_TABLET | ORAL | Status: DC | PRN

## 2015-05-08 MED ORDER — PRENATAL MULTIVITAMIN CH
1.0000 | ORAL_TABLET | Freq: Every day | ORAL | Status: DC
  Administered 2015-05-10: 1 via ORAL
  Filled 2015-05-08: qty 1

## 2015-05-08 MED ORDER — ONDANSETRON HCL 4 MG/2ML IJ SOLN: 4.0000 mg | Freq: Four times a day (QID) | INTRAMUSCULAR | Status: DC | PRN

## 2015-05-08 MED ORDER — OXYTOCIN 40 UNITS IN LACTATED RINGERS INFUSION - SIMPLE MED
1.0000 m[IU]/min | INTRAVENOUS | Status: DC
  Administered 2015-05-08: 1 m[IU]/min via INTRAVENOUS
  Filled 2015-05-08: qty 1000

## 2015-05-08 MED ORDER — ONDANSETRON HCL 4 MG PO TABS: 4.0000 mg | ORAL_TABLET | ORAL | Status: DC | PRN

## 2015-05-08 MED ORDER — FAMOTIDINE 20 MG PO TABS
40.0000 mg | ORAL_TABLET | Freq: Once | ORAL | Status: AC
  Administered 2015-05-09: 20 mg via ORAL
  Filled 2015-05-08: qty 2

## 2015-05-08 MED ORDER — DOCUSATE SODIUM 100 MG PO CAPS
100.0000 mg | ORAL_CAPSULE | Freq: Two times a day (BID) | ORAL | Status: DC
  Administered 2015-05-10 (×2): 100 mg via ORAL
  Filled 2015-05-08 (×2): qty 1

## 2015-05-08 MED ORDER — OXYCODONE-ACETAMINOPHEN 5-325 MG PO TABS
1.0000 | ORAL_TABLET | ORAL | Status: DC | PRN
  Administered 2015-05-09 – 2015-05-10 (×3): 1 via ORAL
  Filled 2015-05-08 (×3): qty 1

## 2015-05-08 MED ORDER — IBUPROFEN 600 MG PO TABS
600.0000 mg | ORAL_TABLET | Freq: Four times a day (QID) | ORAL | Status: DC
  Administered 2015-05-09 – 2015-05-10 (×5): 600 mg via ORAL
  Filled 2015-05-08 (×6): qty 1

## 2015-05-08 NOTE — Anesthesia Preprocedure Evaluation (Addendum)
Anesthesia Evaluation  Patient identified by MRN, date of birth, ID band Patient awake    Reviewed: Allergy & Precautions, H&P , NPO status , Patient's Chart, lab work & pertinent test results, reviewed documented beta blocker date and time   History of Anesthesia Complications Negative for: history of anesthetic complications  Airway Mallampati: I  TM Distance: >3 FB Neck ROM: full    Dental no notable dental hx. (+) Chipped   Pulmonary Current Smoker, former smoker,    Pulmonary exam normal       Cardiovascular hypertension, negative cardio ROS Normal cardiovascular exam    Neuro/Psych PSYCHIATRIC DISORDERS  Neuromuscular disease negative neurological ROS  negative psych ROS   GI/Hepatic Neg liver ROS, GERD-  ,  Endo/Other  negative endocrine ROS  Renal/GU negative Renal ROS  negative genitourinary   Musculoskeletal   Abdominal   Peds  Hematology negative hematology ROS (+)   Anesthesia Other Findings   Reproductive/Obstetrics (+) Pregnancy                           Anesthesia Physical Anesthesia Plan  ASA: II  Anesthesia Plan: Epidural   Post-op Pain Management:    Induction:   Airway Management Planned:   Additional Equipment:   Intra-op Plan:   Post-operative Plan:   Informed Consent: I have reviewed the patients History and Physical, chart, labs and discussed the procedure including the risks, benefits and alternatives for the proposed anesthesia with the patient or authorized representative who has indicated his/her understanding and acceptance.     Plan Discussed with: CRNA  Anesthesia Plan Comments:        Anesthesia Quick Evaluation

## 2015-05-08 NOTE — Progress Notes (Signed)
Pt states that she was just up recently to BR- states that she feels that she could go to BR to void- as unsuccessful - but unable to void. Sstatges she is not in distress and would like to retry in a little while. Please refer to flow sheet.

## 2015-05-08 NOTE — H&P (Signed)
Obstetric History and Physical  Kendra Patel is a 34 y.o. [id number] with IUP at [redacted]w[redacted]d presenting for contractions. Patient states she has been having  Irregular contractions since 1:00 am yesterday, worsening this morning,now every 7-10 minutes. Denies vaginal bleeding, and reports intact membranes, with active fetal movement.    Prenatal Course Source of Care: [facility]  with onset of care at 14 weeks Pregnancy complications or risks: Patient Active Problem List   Diagnosis Date Noted  . Labor and delivery, indication for care 05/08/2015  . H/O preterm delivery, currently pregnant 04/11/2015  . Obesity in pregnancy 03/14/2015  . H/O trichomoniasis 03/14/2015  . Sciatic leg pain 03/14/2015  . Hypertension in pregnancy, pre-existing 03/14/2015  . 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 Abnormal thyroid screen (blood) 03/14/2015  . Tobacco abuse 03/14/2015  . Anxiety and depression 03/14/2015   She plans to breastfeed She desires bilateral tubal ligation for postpartum contraception.   Prenatal labs and studies: ABO, Rh: O/Positive/-- (01/08 0000) Antibody: Negative (01/08 0000) Rubella: Immune (01/08 0000) RPR: Nonreactive (01/08 0000)  HBsAg: Negative (01/08 0000)  HIV: Non-reactive (01/08 0000)  GBS: negative 1 hr Glucola  normal Genetic screening declined Anatomy US normal  Prenatal Transfer Tool  Maternal Diabetes: No Genetic Screening: Declined Maternal Ultrasounds/Referrals: Normal Fetal Ultrasounds or other Referrals:  None Maternal Substance Abuse:  Yes:  Type: Smoker, Marijuana Significant Maternal Medications:  None Significant Maternal Lab Results: None  No past medical history on file.  No past surgical history on file.  OB History  Gravida Para Term Preterm AB SAB TAB Ectopic Multiple Living  # Outcome Date GA Lbr Len/2nd Weight Sex Delivery Anes PTL Lv  3 Current           2 Preterm 11/19/10 [redacted]w[redacted]d  6 lb 2 oz  (2.778 kg) F Vag-Spont  N Y  1 SAB 03/07/00              History   Social History  . Marital Status: Single    Spouse Name: N/A  . Number of Children: N/A  . Years of Education: N/A   Social History Main Topics  . Smoking status: Former Games developer  . Smokeless tobacco: Never Used  . Alcohol Use: No  . Drug Use: No  . Sexual Activity: Yes   Other Topics Concern  . Not on file   Social History Narrative    Family History  Problem Relation Age of Onset  . Thyroid disease Mother     Prescriptions prior to admission  Medication Sig Dispense Refill Last Dose  . acetaminophen (TYLENOL) 325 MG tablet Take 650 mg by mouth every 6 (six) hours as needed.   Taking  . Prenat w/o A Vit-FeFum-FePo-FA (PROVIDA OB) 20-20-1.25 MG CAPS Take 1 capsule by mouth daily.  4 Taking    No Known Allergies  Review of Systems: Negative except for what is mentioned in HPI.  Physical Exam: BP 118/69 mmHg  Pulse 97  Temp(Src) 98.3 F (36.8 C) (Oral) CONSTITUTIONAL: Well-developed, well-nourished female in no acute distress.  HENT:  Normocephalic, atraumatic, External right and left ear normal. Oropharynx is clear and moist EYES: Conjunctivae and EOM are normal. Pupils are equal, round, and reactive to light. No scleral icterus.  NECK: Normal range of motion, supple, no masses SKIN: Skin is warm and dry. No rash noted. Not diaphoretic. No erythema. No pallor. NEUROLGIC: Alert and  oriented to person, place, and time. Normal reflexes, muscle tone coordination. No cranial nerve deficit noted. PSYCHIATRIC: Normal mood and affect. Normal behavior. Normal judgment and thought content. CARDIOVASCULAR: Normal heart rate noted, regular rhythm RESPIRATORY: Effort and breath sounds normal, no problems with respiration noted ABDOMEN: Soft, nontender, nondistended, gravid. MUSCULOSKELETAL: Normal range of motion. No edema and no tenderness. 2+ distal pulses.  Cervical Exam: Dilatation 4 cm   Effacement 60%    Station -1   Presentation: cephalic FHT:  Baseline rate 125 bpm   Variability moderate  Accelerations present   Decelerations none Contractions: Every 4-5 mins   Pertinent Labs/Studies:   No results found for this or any previous visit (from the past 24 hour(s)).  Assessment : Kendra Patel is a 34 y.o. [id number] at [redacted]w[redacted]d being admitted for labor.  Plan: Labor: Expectant management.  Augmentation as needed, per protocol FWB: Reassuring fetal heart tracing.  GBS negative Delivery plan: Hopeful for vaginal delivery UDS for h/o marijuana use in early pregnancy. Desires BTL postpartum.   Hildred Laser, MD [facility]

## 2015-05-08 NOTE — Progress Notes (Signed)
Intrapartum Progress Note  S:  Patient s/p epidural, notes she can still feel occasional contraction.   O: Blood pressure 95/44, pulse 81, temperature 98.3 F (36.8 C), temperature source Oral, resp. rate 22, height  (1.626 m), weight 245 lb (111.131 kg), SpO2 98 %. Gen App: NAD, comfortable Abdomen: soft, gravid FHT: 130 bpm, accels present, moderate variability.  1 deceleration noted at 1208, lasting 3 min with return to baseline.  Tocometer: contractions irregular, q 5-10 min Cervix: 4-5 cm/60/-2.  Extremities: Nontender, no edema.  Labs:  Lab Results  Component Value Date   WBC 9.8 05/08/2015   HGB 12.8 05/08/2015   HCT 38.7 05/08/2015   MCV 92.5 05/08/2015   PLT 184 05/08/2015     Assessment:  1. SIUP at [redacted]w[redacted]d, latent labor 2. Fetal deceleration noted, currently now reassuring.   Plan:  1. AROM with IUPC and FSE placed.  2. Can begin pitocin for augmentation.  3. Anticipate vaginal delivery.   Hildred Laser, MD 05/08/2015 1:00 PM

## 2015-05-08 NOTE — Anesthesia Procedure Notes (Signed)
Epidural Patient location during procedure: OB Start time: 05/08/2015 10:15 AM End time: 05/08/2015 10:30 AM  Staffing Anesthesiologist: Berdine Addison Resident/CRNA: Mathews Argyle Performed by: resident/CRNA   Preanesthetic Checklist Completed: patient identified, site marked, surgical consent, pre-op evaluation, timeout performed, IV checked, risks and benefits discussed and monitors and equipment checked  Epidural Patient position: sitting Prep: Betadine and site prepped and draped Patient monitoring: heart rate, continuous pulse ox and blood pressure Approach: midline Location: L4-L5 Injection technique: LOR saline  Needle:  Needle type: Tuohy  Needle gauge: 18 G Needle length: 9 cm Needle insertion depth: 10 cm Catheter type: closed end flexible Catheter size: 20 Guage Catheter at skin depth: 15 cm Test dose: negative and 1.5% lidocaine with Epi 1:200 K  Assessment Events: blood not aspirated, injection not painful, no injection resistance, negative IV test and no paresthesia  Additional Notes   Patient tolerated the insertion well without complications.Reason for block:procedure for pain

## 2015-05-08 NOTE — OB Triage Note (Signed)
[facility] pt c/o contractions 5-35mins apart, started at 0100. Cervix 4cm dilated.

## 2015-05-09 ENCOUNTER — Encounter
Admission: EM | Disposition: A | Discharge status: Home / Self Care | Payer: Self-pay | Source: Home / Self Care | Attending: Obstetrics and Gynecology

## 2015-05-09 ENCOUNTER — Inpatient Hospital Stay: Payer: Medicaid Other | Admitting: Anesthesiology

## 2015-05-09 ENCOUNTER — Encounter: Payer: Self-pay | Admitting: Obstetrics and Gynecology

## 2015-05-09 HISTORY — PX: TUBAL LIGATION: SHX77

## 2015-05-09 LAB — CBC
HCT: 36.4 % (ref 35.0–47.0)
Hemoglobin: 11.9 g/dL — ABNORMAL LOW (ref 12.0–16.0)
MCH: 30.3 pg (ref 26.0–34.0)
MCHC: 32.7 g/dL (ref 32.0–36.0)
MCV: 92.6 fL (ref 80.0–100.0)
PLATELETS: 177 10*3/uL (ref 150–440)
RBC: 3.93 MIL/uL (ref 3.80–5.20)
RDW: 13.5 % (ref 11.5–14.5)
WBC: 17.6 10*3/uL — AB (ref 3.6–11.0)

## 2015-05-09 LAB — RPR: RPR Ser Ql: NONREACTIVE

## 2015-05-09 LAB — HIV ANTIBODY (ROUTINE TESTING W REFLEX): HIV SCREEN 4TH GENERATION: NONREACTIVE

## 2015-05-09 SURGERY — LIGATION, FALLOPIAN TUBE, POSTPARTUM
Anesthesia: General

## 2015-05-09 MED ORDER — FENTANYL CITRATE (PF) 100 MCG/2ML IJ SOLN
25.0000 ug | INTRAMUSCULAR | Status: DC | PRN
  Administered 2015-05-09 (×4): 25 ug via INTRAVENOUS

## 2015-05-09 MED ORDER — MIDAZOLAM HCL 2 MG/2ML IJ SOLN
INTRAMUSCULAR | Status: DC | PRN
  Administered 2015-05-09: 2 mg via INTRAVENOUS

## 2015-05-09 MED ORDER — LIDOCAINE HCL 2 % EX GEL
CUTANEOUS | Status: DC | PRN
  Administered 2015-05-09: 1 via TOPICAL

## 2015-05-09 MED ORDER — ROCURONIUM BROMIDE 100 MG/10ML IV SOLN
INTRAVENOUS | Status: DC | PRN
  Administered 2015-05-09: 40 mg via INTRAVENOUS

## 2015-05-09 MED ORDER — DEXAMETHASONE SODIUM PHOSPHATE 4 MG/ML IJ SOLN
INTRAMUSCULAR | Status: DC | PRN
  Administered 2015-05-09: 10 mg via INTRAVENOUS

## 2015-05-09 MED ORDER — FENTANYL CITRATE (PF) 100 MCG/2ML IJ SOLN
INTRAMUSCULAR | Status: DC | PRN
  Administered 2015-05-09: 100 ug via INTRAVENOUS

## 2015-05-09 MED ORDER — SODIUM CHLORIDE 0.9 % IV SOLN
INTRAVENOUS | Status: DC | PRN
  Administered 2015-05-09: 11:00:00 via INTRAVENOUS

## 2015-05-09 MED ORDER — ONDANSETRON HCL 4 MG/2ML IJ SOLN
INTRAMUSCULAR | Status: DC | PRN
  Administered 2015-05-09: 4 mg via INTRAVENOUS

## 2015-05-09 MED ORDER — PROPOFOL 10 MG/ML IV BOLUS
INTRAVENOUS | Status: DC | PRN
  Administered 2015-05-09: 150 mg via INTRAVENOUS

## 2015-05-09 MED ORDER — KETOROLAC TROMETHAMINE 30 MG/ML IJ SOLN
INTRAMUSCULAR | Status: DC | PRN
  Administered 2015-05-09: 30 mg via INTRAVENOUS

## 2015-05-09 MED ORDER — BUPIVACAINE HCL 0.5 % IJ SOLN
INTRAMUSCULAR | Status: DC | PRN
  Administered 2015-05-09: 10 mL

## 2015-05-09 MED ORDER — LIDOCAINE HCL (CARDIAC) 20 MG/ML IV SOLN
INTRAVENOUS | Status: DC | PRN
  Administered 2015-05-09: 100 mg via INTRAVENOUS

## 2015-05-09 MED ORDER — ONDANSETRON HCL 4 MG/2ML IJ SOLN: 4.0000 mg | Freq: Once | INTRAMUSCULAR | Status: AC | PRN

## 2015-05-09 MED ORDER — ACETAMINOPHEN 10 MG/ML IV SOLN
INTRAVENOUS | Status: DC | PRN
  Administered 2015-05-09: 1000 mg via INTRAVENOUS

## 2015-05-09 MED ORDER — SUGAMMADEX SODIUM 200 MG/2ML IV SOLN
INTRAVENOUS | Status: DC | PRN
  Administered 2015-05-09: 250 mg via INTRAVENOUS

## 2015-05-09 SURGICAL SUPPLY — 25 items
BLADE SURG SZ11 CARB STEEL (BLADE) ×3 IMPLANT
CHLORAPREP W/TINT 26ML (MISCELLANEOUS) ×3 IMPLANT
DRAPE LAPAROTOMY 100X77 ABD (DRAPES) ×3 IMPLANT
DRSG TEGADERM 2-3/8X2-3/4 SM (GAUZE/BANDAGES/DRESSINGS) ×3 IMPLANT
GAUZE SPONGE NON-WVN 2X2 STRL (MISCELLANEOUS) ×1 IMPLANT
GLOVE BIO SURGEON STRL SZ 6 (GLOVE) ×3 IMPLANT
GLOVE BIOGEL PI IND STRL 6.5 (GLOVE) ×2 IMPLANT
GLOVE BIOGEL PI INDICATOR 6.5 (GLOVE) ×4
GOWN STRL REUS W/ TWL LRG LVL3 (GOWN DISPOSABLE) ×2 IMPLANT
GOWN STRL REUS W/TWL LRG LVL3 (GOWN DISPOSABLE) ×4
KIT RM TURNOVER CYSTO AR (KITS) ×3 IMPLANT
LABEL OR SOLS (LABEL) ×3 IMPLANT
LIQUID BAND (GAUZE/BANDAGES/DRESSINGS) ×3 IMPLANT
NEEDLE HYPO 25GX1X1/2 BEV (NEEDLE) ×3 IMPLANT
NS IRRIG 500ML POUR BTL (IV SOLUTION) ×3 IMPLANT
PACK BASIN MINOR ARMC (MISCELLANEOUS) ×3 IMPLANT
SPONGE VERSALON 2X2 STRL (MISCELLANEOUS) ×2
SUT MNCRL 4-0 (SUTURE) ×2
SUT MNCRL 4-0 27XMFL (SUTURE) ×1
SUT PLAIN GUT 0 (SUTURE) ×6 IMPLANT
SUT VIC AB 0 CT1 36 (SUTURE) ×3 IMPLANT
SUT VIC AB 0 SH 27 (SUTURE) ×3 IMPLANT
SUT VICRYL 0 AB UR-6 (SUTURE) ×3 IMPLANT
SUTURE MNCRL 4-0 27XMF (SUTURE) ×1 IMPLANT
SYRINGE 10CC LL (SYRINGE) ×3 IMPLANT

## 2015-05-09 NOTE — Anesthesia Preprocedure Evaluation (Signed)
Anesthesia Evaluation  Patient identified by MRN, date of birth, ID band Patient awake    Reviewed: Allergy & Precautions, NPO status , Patient's Chart, lab work & pertinent test results  History of Anesthesia Complications (+) PONV  Airway Mallampati: III       Dental  (+) Teeth Intact   Pulmonary Current Smoker, former smoker,    + decreased breath sounds      Cardiovascular hypertension, Normal cardiovascular exam    Neuro/Psych    GI/Hepatic negative GI ROS, Neg liver ROS,   Endo/Other  negative endocrine ROS  Renal/GU negative Renal ROS     Musculoskeletal negative musculoskeletal ROS (+)   Abdominal (+) + obese,   Peds negative pediatric ROS (+)  Hematology negative hematology ROS (+)   Anesthesia Other Findings   Reproductive/Obstetrics negative OB ROS                             Anesthesia Physical Anesthesia Plan  ASA: II  Anesthesia Plan: General   Post-op Pain Management:    Induction: Intravenous  Airway Management Planned: Oral ETT  Additional Equipment:   Intra-op Plan:   Post-operative Plan: Extubation in OR  Informed Consent: I have reviewed the patients History and Physical, chart, labs and discussed the procedure including the risks, benefits and alternatives for the proposed anesthesia with the patient or authorized representative who has indicated his/her understanding and acceptance.     Plan Discussed with: CRNA  Anesthesia Plan Comments:         Anesthesia Quick Evaluation

## 2015-05-09 NOTE — Anesthesia Procedure Notes (Signed)
Procedure Name: Intubation Date/Time: 05/09/2015 10:58 AM Performed by: Stormy Fabian Pre-anesthesia Checklist: Patient identified, Patient being monitored, Timeout performed, Emergency Drugs available and Suction available Patient Re-evaluated:Patient Re-evaluated prior to inductionOxygen Delivery Method: Circle system utilized Preoxygenation: Pre-oxygenation with 100% oxygen Intubation Type: IV induction Ventilation: Mask ventilation without difficulty Laryngoscope Size: Mac and 3 Grade View: Grade I Tube type: Oral Tube size: 7.0 mm Number of attempts: 1 Airway Equipment and Method: Stylet Placement Confirmation: ETT inserted through vocal cords under direct vision,  positive ETCO2 and breath sounds checked- equal and bilateral Secured at: 21 cm Tube secured with: Tape Dental Injury: Teeth and Oropharynx as per pre-operative assessment

## 2015-05-09 NOTE — Transfer of Care (Signed)
Immediate Anesthesia Transfer of Care Note  Patient: Kendra Patel  Procedure(s) Performed: Procedure(s): POST PARTUM TUBAL LIGATION (N/A)  Patient Location: PACU  Anesthesia Type:General  Level of Consciousness: sedated  Airway & Oxygen Therapy: Patient Spontanous Breathing and Patient connected to face mask oxygen  Post-op Assessment: Report given to RN and Post -op Vital signs reviewed and stable  Post vital signs: Reviewed and stable  Last Vitals:  Filed Vitals:   05/09/15 1145  BP: 107/69  Pulse: 102  Temp: 36.2 C  Resp: 24    Complications: No apparent anesthesia complications

## 2015-05-09 NOTE — Progress Notes (Signed)
OB/GYN Preoperative Note  05/09/2015  8:40 AM  PATIENT:  Kendra Patel  34 y.o. female  PRE-OPERATIVE DIAGNOSIS: multiparous,  desires sterilization  PROCEDURE:  Procedure(s): POST PARTUM TUBAL LIGATION (N/A)  SURGEON:  Surgeon(s) and Role:    * Hildred Laser, MD - Primary  ANESTHESIA:   general   LABS:  Lab Results  Component Value Date   ABORH O POS 05/08/2015   CBC Latest Ref Rng 05/09/2015 05/08/2015  WBC 3.6 - 11.0 K/uL 17.6(H) 9.8  Hemoglobin 12.0 - 16.0 g/dL 11.9(L) 12.8  Hematocrit 35.0 - 47.0 % 36.4 38.7  Platelets 150 - 440 K/uL 177 184    Counseling:  Patient desires permanent sterilization.  Other reversible forms of contraception were discussed with patient; she declines all other modalities. Risks of procedure discussed with patient including but not limited to: risk of regret, permanence of method, bleeding, infection, injury to surrounding organs and need for additional procedures.  Failure risk of 1-2 % with increased risk of ectopic gestation if pregnancy occurs was also discussed with patient.  Patient verbalized understanding of these risks and wants to proceed with sterilization.  Written informed consent obtained.  To OR when ready.   Hildred Laser, MD [facility]

## 2015-05-09 NOTE — Op Note (Signed)
Bilateral Tubal Ligation Procedure Note  Pre-operative Diagnosis: Multiparity, postpartum, desires permanent sterilization  Post-operative Diagnosis: same  Surgeon: Hildred Laser, MD  Assistants: None  Procedure: Postpartum bilateral tubal ligation  Anesthesia: General anesthesia  ASA Class: 2  Procedure Details: INDICATIONS:  34 y.o. [id number] with undesired fertility,status post vaginal delivery, desires permanent sterilization.  Other reversible forms of contraception were discussed with patient; she declines all other modalities. Risks of procedure discussed with patient including but not limited to: risk of regret, permanence of method, bleeding, infection, injury to surrounding organs and need for additional procedures.  Failure risk of 1 -2 % with increased risk of ectopic gestation if pregnancy occurs was also discussed with patient.      DETAILS: The patient was taken to the operating room where her epidural anesthesia was dosed up to surgical level and found to be adequate.  She was then placed in the dorsal supine position and prepped and draped in sterile fashion.  After an adequate timeout was performed, attention was turned to the patient's abdomen where a small transverse skin incision was made under the umbilical fold. The incision was taken down to the layer of fascia using the scalpel, and fascia was incised, and extended bilaterally using Mayo scissors. The peritoneum was entered in a sharp fashion. Attention was then turned to the patient's uterus, and left fallopian tube was identified and followed out to the fimbriated end.  The Babcock clamp was then used to grasp the tube approximately 4 cm from the cornual region.  A 3 cm segment of tube was then ligated with a free tie of 0-Chromic using the Pomeroy method and excised.  The right fallopian tube was then ligated in a similar fashion and excised. The tubal lumens were cauterized bilaterally.  Good hemostasis was noted with  bilateral fallopian tubes. The uterus was then returned to the abdomen.   The instruments were then removed from the patient's abdomen and the fascial incision was repaired with 0 Vicryl, and the skin was closed with a 4-0 Vicryl subcuticular stitch. The incision was then injected with 10 ml of 0.5% Bupivicaine. The incision was covered with Liquiband. The patient tolerated the procedure well.  Instrument, sponge, and needle counts were correct times two.  The patient was then taken to the recovery room awake and in stable condition.   Findings: Uterus ~ 2 cm above umbilicus.  Normal appearing fallopian tubes bilaterally.   Estimated Blood Loss:  10 ml      Drains: None; patient voided prior to procedure         Total IV Fluids:  700 ml  Specimens: Segments of left and right tube          Implants: None         Complications:  None; patient tolerated the procedure well.         Disposition: PACU - hemodynamically stable.         Condition: stable   SIGNED:  Hildred Laser, MD [facility]

## 2015-05-09 NOTE — Progress Notes (Signed)
Post Partum Day 1 Subjective: no complaints, up ad lib, voiding and tolerating PO  Objective: Temp:  [97.6 F (36.4 C)-98.3 F (36.8 C)] 97.6 F (36.4 C) (07/27 0328) Pulse Rate:  [75-110] 75 (07/27 0328) Resp:  [18-20] 18 (07/27 0328) BP: (95-145)/(44-87) 114/66 mmHg (07/27 0328) SpO2:  [98 %-100 %] 99 % (07/27 0328) Weight:  [245 lb (111.131 kg)] 245 lb (111.131 kg) (07/26 0802)  Physical Exam:  General: alert and no distress Lochia: appropriate Uterine Fundus: firm Incision: none DVT Evaluation: No evidence of DVT seen on physical exam. No cords or calf tenderness. No significant calf/ankle edema.   Recent Labs  05/08/15 0848 05/09/15 0521  HGB 12.8 11.9*  HCT 38.7 36.4    Assessment/Plan: Plan for discharge tomorrow and Contraception postpartum BTL to be performed today.  Has been NPO since midnight.   Breast and bottle feeding.   LOS: 1 day   Hildred Laser 05/09/2015, 7:55 AM

## 2015-05-09 NOTE — Anesthesia Postprocedure Evaluation (Signed)
  Anesthesia Post-op Note  Patient: Kendra Patel  Procedure(s) Performed: Epidural  Anesthesia type: Epidural  Patient location: Room 342  Post pain: Pain level controlled  Post assessment: Post-op Vital signs reviewed, Patient's Cardiovascular Status Stable, Respiratory Function Stable, Patent Airway and No signs of Nausea or vomiting  Post vital signs: Reviewed and stable  Last Vitals:  Filed Vitals:   05/09/15 0328  BP: 114/66  Pulse: 75  Temp: 36.4 C  Resp: 18    Level of consciousness: awake, alert  and patient cooperative  Complications: No apparent anesthesia complications

## 2015-05-10 DIAGNOSIS — Z9851 Tubal ligation status: Secondary | ICD-10-CM

## 2015-05-10 LAB — SURGICAL PATHOLOGY

## 2015-05-10 MED ORDER — ACETAMINOPHEN-CODEINE #3 300-30 MG PO TABS: 1.0000 | ORAL_TABLET | Freq: Four times a day (QID) | ORAL | Status: DC | PRN

## 2015-05-10 MED ORDER — IBUPROFEN 800 MG PO TABS: 800.0000 mg | ORAL_TABLET | Freq: Three times a day (TID) | ORAL | Status: DC | PRN

## 2015-05-10 NOTE — Anesthesia Postprocedure Evaluation (Signed)
  Anesthesia Post-op Note  Patient: Kendra Patel  Procedure(s) Performed: Procedure(s): POST PARTUM TUBAL LIGATION (N/A)  Anesthesia type:General  Patient location: 337   Post pain: Pain level controlled  Post assessment: Post-op Vital signs reviewed, Patient's Cardiovascular Status Stable, Respiratory Function Stable, Patent Airway and No signs of Nausea or vomiting  Post vital signs: Reviewed and stable  Last Vitals:  Filed Vitals:   05/10/15 0728  BP: 121/84  Pulse: 73  Temp: 36.7 C  Resp: 18    Level of consciousness: awake, alert  and patient cooperative  Complications: No apparent anesthesia complications

## 2015-05-10 NOTE — Discharge Instructions (Signed)

## 2015-05-10 NOTE — Discharge Summary (Signed)
Obstetric Discharge Summary Reason for Admission: onset of labor Prenatal Procedures: ultrasound Intrapartum Procedures: spontaneous vaginal delivery Postpartum Procedures: P.P. tubal ligation Complications-Operative and Postpartum: none HEMOGLOBIN  Date Value Ref Range Status  05/09/2015 11.9* 12.0 - 16.0 g/dL Final  40/98/1191 47.8 g/dL Final   HGB  Date Value Ref Range Status  05/09/2013 12.9 12.0-16.0 g/dL Final   HCT  Date Value Ref Range Status  05/09/2015 36.4 35.0 - 47.0 % Final  10/20/2014 50 % Final  05/09/2013 37.7 35.0-47.0 % Final    Physical Exam:  General: alert and no distress Lochia: appropriate Uterine Fundus: firm Incision: healing well, no significant drainage, no significant erythema DVT Evaluation: No evidence of DVT seen on physical exam. No cords or calf tenderness. No significant calf/ankle edema.  Discharge Diagnoses: Term Pregnancy-delivered, s/p postpartum tubal ligation  Discharge Information: Date: 05/10/2015 Activity: pelvic rest Diet: routine Medications: PNV, Tylenol #3 and Ibuprofen Condition: stable Instructions: refer to practice specific booklet Discharge to: home Follow-up Information    Follow up with Hildred Laser, MD In 6 weeks.   Specialties:  Obstetrics and Gynecology, Radiology   Why:  Postpartum visit   Contact information:   [address] [place] [address] [phone]       Newborn Data: Live born female  Birth Weight: 7 lb 15 oz (3600 g) APGAR: 9, 9  Home with mother.  Hildred Laser 05/10/2015, 8:04 AM

## 2015-05-10 NOTE — Progress Notes (Signed)
Social worker in to see patient  

## 2015-05-10 NOTE — Progress Notes (Signed)
Post Partum Day 2 s/p SVD  Post Operative Day 1 s/p PP BTL  Subjective: no complaints, up ad lib, voiding and tolerating PO.  Objective: Blood pressure 121/84, pulse 73, temperature 98.1 F (36.7 C), temperature source Oral, resp. rate 18, height  (1.626 m), weight 245 lb (111.131 kg), SpO2 100 %, unknown if currently breastfeeding.  Physical Exam:  General: alert and no distress Lochia: appropriate Uterine Fundus: firm Incision: clean, dry, intact DVT Evaluation: No evidence of DVT seen on physical exam. No cords or calf tenderness. No significant calf/ankle edema.   Recent Labs  05/08/15 0848 05/09/15 0521  HGB 12.8 11.9*  HCT 38.7 36.4    Assessment/Plan: Discharge home and Contraception postpartum BTL, performed yesterday.  Breast and bottle feeding.   LOS: 2 days   Hildred Laser 05/10/2015, 7:58 AM

## 2015-06-19 ENCOUNTER — Encounter: Payer: Self-pay | Admitting: Obstetrics and Gynecology

## 2015-06-19 ENCOUNTER — Ambulatory Visit (INDEPENDENT_AMBULATORY_CARE_PROVIDER_SITE_OTHER): Payer: Medicaid Other | Admitting: Obstetrics and Gynecology

## 2015-06-19 DIAGNOSIS — Z9851 Tubal ligation status: Secondary | ICD-10-CM

## 2015-06-19 NOTE — Progress Notes (Signed)
Subjective:     Kendra Patel is a 34 y.o. [id number] female who presents for a postpartum visit. She is 6 weeks postpartum following a spontaneous vaginal delivery. I have fully reviewed the prenatal and intrapartum course. The delivery was at 40.5 gestational weeks. Anesthesia: epidural. Postpartum course has been unremarkable. Baby's course has been unremarkable. Baby is feeding by breast and bottle. Bleeding has resumed with LMP 06/14/2015.Marland Kitchen Bowel function is normal. Bladder function is normal. Patient is not sexually active. Contraception method is tubal ligation. Postpartum depression screening: negative.  The following portions of the patient's history were reviewed and updated as appropriate: allergies, current medications, past family history, past medical history, past social history, past surgical history and problem list.  Review of Systems Pertinent items are noted in HPI.   Objective:    BP 129/87 mmHg  Pulse 87  Ht  (1.676 m)  Wt 219 lb 3.2 oz (99.428 kg)  BMI 35.40 kg/m2 LMP 06/14/2015.   General:  alert and no distress   Breasts:  inspection negative, no nipple discharge or bleeding, no masses or nodularity palpable  Lungs: clear to auscultation bilaterally  Heart:  regular rate and rhythm, S1, S2 normal, no murmur, click, rub or gallop  Abdomen: soft, non-tender; bowel sounds normal; no masses,  no organomegaly   Vulva:  normal  Vagina: normal vagina, no discharge, exudate, lesion, or erythema and small amount of dark red blood in vaginal vault  Cervix:  multiparous appearance and no lesions  Corpus: normal size, contour, position, consistency, mobility, non-tender  Adnexa:  no mass, fullness, tenderness  Rectal Exam: Not performed.         Lab Results  Component Value Date   HGB 11.9* 05/09/2015     Assessment:   Routine postpartum exam. Pap smear not done at today's visit.   Plan:    1. Contraception: postpartum tubal ligation 2. Doing well postpartum.   3. Follow up in: 2 months for annual exam or as needed.    Hildred Laser, MD [facility]

## 2015-08-08 ENCOUNTER — Encounter: Payer: Medicaid Other | Admitting: Obstetrics and Gynecology

## 2019-09-19 ENCOUNTER — Other Ambulatory Visit: Payer: Self-pay

## 2019-09-19 DIAGNOSIS — Z20822 Contact with and (suspected) exposure to covid-19: Secondary | ICD-10-CM

## 2019-09-20 LAB — NOVEL CORONAVIRUS, NAA: SARS-CoV-2, NAA: NOT DETECTED

## 2019-09-21 ENCOUNTER — Telehealth: Payer: Self-pay | Admitting: General Practice

## 2019-09-21 NOTE — Telephone Encounter (Signed)
Negative COVID results given. Patient results "NOT Detected." Caller expressed understanding. ° °

## 2019-12-10 ENCOUNTER — Other Ambulatory Visit: Payer: Self-pay

## 2019-12-10 ENCOUNTER — Ambulatory Visit: Payer: Medicaid Other | Attending: Internal Medicine

## 2019-12-10 DIAGNOSIS — Z23 Encounter for immunization: Secondary | ICD-10-CM

## 2019-12-10 NOTE — Progress Notes (Signed)
   Covid-19 Vaccination Clinic  Name:  Kendra Patel    MRN: [id number] DOB: 1981/09/05  12/10/2019  Kendra Patel was observed post Covid-19 immunization for 15 minutes without incidence. She was provided with Vaccine Information Sheet and instruction to access the V-Safe system.   Kendra Patel was instructed to call 911 with any severe reactions post vaccine: Marland Kitchen Difficulty breathing  . Swelling of your face and throat  . A fast heartbeat  . A bad rash all over your body  . Dizziness and weakness    Immunizations Administered    Name Date Dose VIS Date Route   Moderna COVID-19 Vaccine 12/10/2019  1:02 PM 0.5 mL 09/13/2019 Intramuscular   Manufacturer: Moderna   Lot: [id number]   NDC: 40375-436-06

## 2020-01-07 ENCOUNTER — Ambulatory Visit: Payer: Medicaid Other | Attending: Internal Medicine

## 2020-01-07 DIAGNOSIS — Z23 Encounter for immunization: Secondary | ICD-10-CM

## 2020-01-07 NOTE — Progress Notes (Signed)
   Covid-19 Vaccination Clinic  Name:  Kendra Patel    MRN: [id number] DOB: 05-05-1981  01/07/2020  [name] was observed post Covid-19 immunization for 15 minutes without incident. She was provided with Vaccine Information Sheet and instruction to access the V-Safe system.   [name] was instructed to call 911 with any severe reactions post vaccine: Marland Kitchen Difficulty breathing  . Swelling of face and throat  . A fast heartbeat  . A bad rash all over body  . Dizziness and weakness   Immunizations Administered    Name Date Dose VIS Date Route   Moderna COVID-19 Vaccine 01/07/2020  1:04 PM 0.5 mL 09/13/2019 Intramuscular   Manufacturer: Gala Murdoch   Lot: [id number]   NDC: 66599-357-01

## 2021-04-03 ENCOUNTER — Other Ambulatory Visit: Payer: Self-pay

## 2021-04-03 ENCOUNTER — Encounter: Payer: Self-pay | Admitting: Physician Assistant

## 2021-04-03 ENCOUNTER — Ambulatory Visit: Payer: Medicaid Other | Admitting: Physician Assistant

## 2021-04-03 DIAGNOSIS — Z113 Encounter for screening for infections with a predominantly sexual mode of transmission: Secondary | ICD-10-CM

## 2021-04-03 DIAGNOSIS — A5901 Trichomonal vulvovaginitis: Secondary | ICD-10-CM

## 2021-04-03 LAB — WET PREP FOR TRICH, YEAST, CLUE
Trichomonas Exam: POSITIVE — AB
Yeast Exam: NEGATIVE

## 2021-04-03 MED ORDER — METRONIDAZOLE 500 MG PO TABS: 500.0000 mg | ORAL_TABLET | Freq: Two times a day (BID) | ORAL | 0 refills | Status: AC

## 2021-04-03 NOTE — Progress Notes (Addendum)
[facility] Department STI clinic/screening visit  Subjective:  Kendra Patel is a 40 y.o. female being seen today for an STI screening visit. The patient reports they do have symptoms.  Patient reports that they do not desire a pregnancy in the next year.   They reported they are not interested in discussing contraception today.  Patient's last menstrual period was 03/21/2021 (approximate).   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  Denies other symptoms, chronic conditions and regular medicines.  States last HIV test was in 2016 and that is also when she had her last pap.  Reports that she has had a BTL for her BCM and last period was 03/13/2021.   See flowsheet for further details and programmatic requirements.    The following portions of the patient's history were reviewed and updated as appropriate: allergies, current medications, past medical history, past social history, past surgical history and problem list.  Objective:  There were no vitals filed for this visit.  Physical Exam Constitutional:      General: She is not in acute distress.    Appearance: Normal appearance.   HENT:     Head: Normocephalic and atraumatic.     Comments: No nits,lice, or hair loss. No cervical, supraclavicular or axillary adenopathy.     Mouth/Throat:     Mouth: Mucous membranes are moist.     Pharynx: Oropharynx is clear. No oropharyngeal exudate or posterior oropharyngeal erythema.  Eyes:     Conjunctiva/sclera: Conjunctivae normal.  Pulmonary:     Effort: Pulmonary effort is normal.  Abdominal:     Palpations: Abdomen is soft. There is no mass.     Tenderness: There is no abdominal tenderness. There is no guarding or rebound.  Genitourinary:    General: Normal vulva.     Rectum: Normal.     Comments: External genitalia/pubic area without nits, lice, edema, erythema, lesions and inguinal adenopathy. Vagina with normal mucosa and small amount of yellowish discharge. Cervix without visible lesions. Uterus firm, mobile, nt, no masses, no CMT, no adnexal tenderness or fullness.  Musculoskeletal:     Cervical back: Neck supple. No tenderness.  Skin:    General: Skin is warm and dry.     Findings: No bruising, erythema, lesion or rash.  Neurological:     Mental Status: She is alert and oriented to person, place, and time.  Psychiatric:        Mood and Affect: Mood normal.        Behavior: Behavior normal.        Thought Content: Thought content normal.        Judgment: Judgment normal.     Assessment and Plan:  Kendra Patel is a 40 y.o. female presenting to the [facility] Department for STI screening  1. Screening for STD (sexually transmitted disease) Patient into clinic with symptoms. Patient declines blood work today.  Rec condoms with all sex. Await test results.  Counseled that RN will call if needs to RTC for treatment once results are back.  Enc patient to establish with PCP for regular PE and pap as well as age appropriate screenings. - WET PREP FOR TRICH, YEAST, CLUE - Chlamydia/Gonorrhea [facility]  2. Trichomonal  vulvovaginitis Treat Trich with Metronidazole 500 mg #14 1 po BID for 7 days with food, no EtOH for 24 hr before and until 72 hr after completing medicine. No sex for 14 days and until after partner completes treatment. Enc to use OTC antifungal cream if has itching during or just after antibiotic use.  - metroNIDAZOLE (FLAGYL) 500 MG tablet; Take 1 tablet (500 mg total) by mouth 2 (two) times daily for 7 days.  Dispense: 14 tablet; Refill: 0     Return in about 3 months (around 07/04/2021) for TOC.  No future appointments.  Matt Holmes, PA

## 2021-04-03 NOTE — Progress Notes (Signed)
Wet mount reviewed by provider; pt treated for trich per provider orders. Provider orders completed.

## 2021-08-15 ENCOUNTER — Encounter: Payer: Self-pay | Admitting: Obstetrics and Gynecology

## 2021-08-15 ENCOUNTER — Other Ambulatory Visit: Payer: Self-pay

## 2021-08-15 ENCOUNTER — Ambulatory Visit (INDEPENDENT_AMBULATORY_CARE_PROVIDER_SITE_OTHER): Payer: Self-pay | Admitting: Obstetrics and Gynecology

## 2021-08-15 VITALS — BP 146/95 | HR 90 | Ht 66.0 in | Wt 201.3 lb

## 2021-08-15 DIAGNOSIS — Z3202 Encounter for pregnancy test, result negative: Secondary | ICD-10-CM

## 2021-08-15 DIAGNOSIS — N926 Irregular menstruation, unspecified: Secondary | ICD-10-CM

## 2021-08-15 DIAGNOSIS — Z3200 Encounter for pregnancy test, result unknown: Secondary | ICD-10-CM

## 2021-08-15 LAB — POCT URINE PREGNANCY: Preg Test, Ur: NEGATIVE

## 2021-08-15 NOTE — Progress Notes (Signed)
HPI:      Kendra Patel is a 40 y.o. [id number] who LMP was Patient's last menstrual period was 07/03/2021 (approximate).  Subjective:   She presents today stating that over the last 2 to 3 weeks she has felt like she is pregnant.  She has a tubal ligation for birth control.  She complains of breast tenderness, missed menstrual periods, increased appetite and weight gain, fetal movement in her left lower quadrant -"flutters", increased urination etc.  She did 2 home tests and they were both negative.  We did a urine pregnancy test today and it too was negative.  Nevertheless, patient insist that there is a possibility she could be pregnant because she has such defining symptoms. She reports no significant changes in bowel or bladder habits. She states that she would not be upset if she were pregnant. There is no one close to her at work or at home who is pregnant.    Hx: The following portions of the patient's history were reviewed and updated as appropriate:             She  has no past medical history on file. She does not have any pertinent problems on file. She  has a past surgical history that includes Tubal ligation (N/A, 05/09/2015). Her family history includes Thyroid disease in her mother. She  reports that she has quit smoking. Her smoking use included cigarettes. She smoked an average of .25 packs per day. She has never used smokeless tobacco. She reports current drug use. Drug: Marijuana. She reports that she does not drink alcohol. She has a current medication list which includes the following prescription(s): acetaminophen-codeine, ibuprofen, and provida ob. She has No Known Allergies.       Review of Systems:  Review of Systems  Constitutional: Denied constitutional symptoms, night sweats, recent illness, fatigue, fever, insomnia and weight loss.  Eyes: Denied eye symptoms, eye pain, photophobia, vision change and visual disturbance.  Ears/Nose/Throat/Neck: Denied ear,  nose, throat or neck symptoms, hearing loss, nasal discharge, sinus congestion and sore throat.  Cardiovascular: Denied cardiovascular symptoms, arrhythmia, chest pain/pressure, edema, exercise intolerance, orthopnea and palpitations.  Respiratory: Denied pulmonary symptoms, asthma, pleuritic pain, productive sputum, cough, dyspnea and wheezing.  Gastrointestinal: Denied, gastro-esophageal reflux, melena, nausea and vomiting.  Genitourinary: See HPI for additional information.  Musculoskeletal: Denied musculoskeletal symptoms, stiffness, swelling, muscle weakness and myalgia.  Dermatologic: Denied dermatology symptoms, rash and scar.  Neurologic: Denied neurology symptoms, dizziness, headache, neck pain and syncope.  Psychiatric: Denied psychiatric symptoms, anxiety and depression.  Endocrine: Denied endocrine symptoms including hot flashes and night sweats.   Meds:   Current Outpatient Medications on File Prior to Visit  Medication Sig Dispense Refill   acetaminophen-codeine (TYLENOL #3) 300-30 MG per tablet Take 1-2 tablets by mouth every 6 (six) hours as needed for moderate pain. (Patient not taking: No sig reported) 20 tablet 0   ibuprofen (ADVIL,MOTRIN) 800 MG tablet Take 1 tablet (800 mg total) by mouth every 8 (eight) hours as needed. (Patient not taking: No sig reported) 60 tablet 1   Prenat w/o A Vit-FeFum-FePo-FA (PROVIDA OB) 20-20-1.25 MG CAPS Take 1 capsule by mouth daily. (Patient not taking: No sig reported)  4   No current facility-administered medications on file prior to visit.      Objective:     Vitals:   08/15/21 0848  BP: (!) 146/95  Pulse: 90   Filed Weights   08/15/21 0848  Weight: 201 lb 4.8 oz (  91.3 kg)              Abdominal examination reveals no masses, normal bowel sounds, some increased sensitivity/pressure noted by the patient during exam on the left side.  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 Missed menses   2. Possible pregnancy, not confirmed     Patient with signs and symptoms of pregnancy without pregnancy.  History of tubal ligation.   Plan:            1.  Lab work for missed menses and serum hCG to reassure the patient.  2.  Ultrasound to examine uterus and left adnexa specifically. Orders Orders Placed This Encounter  Procedures   US Pelvis Complete   US Transvaginal Non-OB   DHEA-sulfate   FSH/LH   Glucose, fasting   TSH   Testosterone, Free, Total, SHBG   Prolactin   Insulin, random   Beta hCG quant (ref lab)   POCT urine pregnancy    No orders of the defined types were placed in this encounter.     F/U  Return for We will contact her with any abnormal test results. I spent 33 minutes involved in the care of this patient preparing to see the patient by obtaining and reviewing her medical history (including labs, imaging tests and prior procedures), documenting clinical information in the electronic health record (EHR), counseling and coordinating care plans, writing and sending prescriptions, ordering tests or procedures and in direct communicating with the patient and medical staff discussing pertinent items from her history and physical exam.  Elonda Husky, M.D. 08/15/2021 9:23 AM

## 2021-08-17 LAB — TESTOSTERONE, FREE, TOTAL, SHBG
Sex Hormone Binding: 81.5 nmol/L (ref 24.6–122.0)
Testosterone, Free: 0.8 pg/mL (ref 0.0–4.2)
Testosterone: 24 ng/dL (ref 8–60)

## 2021-08-17 LAB — PROLACTIN: Prolactin: 17.7 ng/mL (ref 4.8–23.3)

## 2021-08-17 LAB — INSULIN, RANDOM: INSULIN: 8.4 u[IU]/mL (ref 2.6–24.9)

## 2021-08-17 LAB — DHEA-SULFATE: DHEA-SO4: 120 ug/dL (ref 57.3–279.2)

## 2021-08-17 LAB — FSH/LH
FSH: 56.2 m[IU]/mL
LH: 42 m[IU]/mL

## 2021-08-17 LAB — TSH: TSH: 0.783 u[IU]/mL (ref 0.450–4.500)

## 2021-08-17 LAB — GLUCOSE, FASTING: Glucose, Plasma: 73 mg/dL (ref 70–99)

## 2021-08-17 LAB — BETA HCG QUANT (REF LAB): hCG Quant: <1 m[IU]/mL

## 2021-08-20 ENCOUNTER — Telehealth: Payer: Self-pay | Admitting: Obstetrics and Gynecology

## 2021-08-20 NOTE — Telephone Encounter (Signed)
Called pt to schedule [place]- she asked for message to be sent in following up on lab results. Please Advise

## 2021-08-22 NOTE — Telephone Encounter (Signed)
Pt is calling in stating that she is very curious to know what her lab results are.  Pt is aware that Dr. Logan Bores has not been in the office but we will send a message back for when he comes back in to the office.

## 2021-08-23 NOTE — Telephone Encounter (Signed)
Called patient to inform her that her labs were normal. She verbalized understanding.

## 2021-08-26 NOTE — Progress Notes (Signed)
Needs visit or video visit to discuss

## 2021-08-29 ENCOUNTER — Other Ambulatory Visit: Payer: Self-pay | Admitting: Obstetrics and Gynecology

## 2021-08-29 ENCOUNTER — Other Ambulatory Visit: Payer: Self-pay

## 2021-08-29 ENCOUNTER — Ambulatory Visit (INDEPENDENT_AMBULATORY_CARE_PROVIDER_SITE_OTHER): Payer: Self-pay

## 2021-08-29 DIAGNOSIS — N926 Irregular menstruation, unspecified: Secondary | ICD-10-CM

## 2021-09-07 NOTE — Progress Notes (Signed)
Based on these results, please have her schedule a visit with me.  May be a video visit if desired.

## 2021-09-16 ENCOUNTER — Encounter: Payer: Medicaid Other | Admitting: Obstetrics and Gynecology

## 2023-01-22 ENCOUNTER — Ambulatory Visit: Payer: BLUE CROSS/BLUE SHIELD

## 2023-01-23 ENCOUNTER — Ambulatory Visit: Payer: BLUE CROSS/BLUE SHIELD

## 2023-10-09 ENCOUNTER — Ambulatory Visit (INDEPENDENT_AMBULATORY_CARE_PROVIDER_SITE_OTHER): Payer: Medicaid Other | Admitting: Family Medicine

## 2023-10-09 ENCOUNTER — Encounter: Payer: Self-pay | Admitting: Family Medicine

## 2023-10-09 VITALS — BP 174/103 | HR 80 | Temp 97.8°F | Resp 16 | Ht 65.0 in | Wt 227.7 lb

## 2023-10-09 DIAGNOSIS — E66812 Obesity, class 2: Secondary | ICD-10-CM | POA: Diagnosis not present

## 2023-10-09 DIAGNOSIS — R42 Dizziness and giddiness: Secondary | ICD-10-CM | POA: Diagnosis not present

## 2023-10-09 DIAGNOSIS — N951 Menopausal and female climacteric states: Secondary | ICD-10-CM | POA: Insufficient documentation

## 2023-10-09 DIAGNOSIS — F17219 Nicotine dependence, cigarettes, with unspecified nicotine-induced disorders: Secondary | ICD-10-CM | POA: Insufficient documentation

## 2023-10-09 DIAGNOSIS — Z720 Tobacco use: Secondary | ICD-10-CM

## 2023-10-09 DIAGNOSIS — I10 Essential (primary) hypertension: Secondary | ICD-10-CM | POA: Insufficient documentation

## 2023-10-09 DIAGNOSIS — Z7689 Persons encountering health services in other specified circumstances: Secondary | ICD-10-CM | POA: Insufficient documentation

## 2023-10-09 DIAGNOSIS — Z1159 Encounter for screening for other viral diseases: Secondary | ICD-10-CM

## 2023-10-09 DIAGNOSIS — Z6837 Body mass index (BMI) 37.0-37.9, adult: Secondary | ICD-10-CM

## 2023-10-09 MED ORDER — MECLIZINE HCL 12.5 MG PO TABS: 12.5000 mg | ORAL_TABLET | Freq: Three times a day (TID) | ORAL | 0 refills | Status: DC | PRN

## 2023-10-09 MED ORDER — AMLODIPINE BESYLATE 2.5 MG PO TABS: 2.5000 mg | ORAL_TABLET | Freq: Every day | ORAL | 0 refills | Status: DC

## 2023-10-09 NOTE — Assessment & Plan Note (Addendum)
Welcome to [facility] Labs checked Problems addressed today: HTN, dizziness, tobacco use, and perimenopausal symptoms. Deferred mammogram today

## 2023-10-09 NOTE — Assessment & Plan Note (Signed)
Half pack per day or less, smoking for 26 years.  - Discussed cessation, willing to decrease to 5-6 per day. Is motivated to quit - Dicussed affect on BP and tobacco use.

## 2023-10-09 NOTE — Assessment & Plan Note (Signed)
Managed currently, discussed if hot flashes become unbearable may discuss non-hormonal options, as estrogen but option given HTN as this time. - Tubal ligation hx - still intermittently getting periods.

## 2023-10-09 NOTE — Assessment & Plan Note (Signed)
Weight loss and exercise stressed - A1C - Lipid panel

## 2023-10-09 NOTE — Assessment & Plan Note (Signed)
Intermittent dizziness, does not appear central nervous in nature, nor syncopal. HTN vs vertigo. Pt hx of uncontrolled BP, dizziness could be symptom of fluctuating BP. States has been taking dramamine 1-2x week - which helps, but makes her sleepy.  - Less likely vertigo, but will do short trial of meclizine as well. - Discussed Epley Maneuver - Increased water intake

## 2023-10-09 NOTE — Assessment & Plan Note (Addendum)
Uncontrolled BP, appears to chronically be in 160s-170s Attempted med mgmt with lisinopril and hydrochlorothiazide in November, but felt horrible. Potentially due to decreasing BP to fast causing less normal perfusion to end organs than her body is used to.  - Will start low with amlodipine 2.5 mg po daily - Monitor BP daily - Goal SBP<130, DBP<80 - DASH diet, exercise, decrease sugary drinks - Tobacco cessation - Discussed eliminating NSAIDs until BP controlled - Discussed emergent BP symptoms and when to present to ED - f/u 2-3 weeks for BP check - CMP

## 2023-10-09 NOTE — Progress Notes (Addendum)
New Patient Office Visit  Introduced to nurse practitioner role and practice setting.  All questions answered.  Discussed provider/patient relationship and expectations.   Subjective    Patient ID: Kendra Patel, female    DOB: 15-Jul-1981  Age: 42 y.o. MRN: [id number]  CC:  Chief Complaint  Patient presents with   Establish Care    Dizziness ( since September on & off) that prompted patient to go to ED and check BP  No longer on linsiprol and water pill / told she has elevated BP  Possible vertigo when patient went to [facility] in [place]    HPI Kendra Patel presents to establish care for primary care provider. Biggest concerns today are blood pressure and dizziness.   Blood pressure - always told she has elevated BP. Since October she has been having intermittent dizziness spells. Went to Ed 08/20/23 for dizziness work up - was told was not vertigo, but most likely due to uncontrolled BP. Was given lisinopril 5 mg daily and HCTZ 25 mg daily. Pt felt horrible on medications and stopped them, felt made her sleepy and dizziness worse, and felt she was not peeing as much. States intermittently takes BP at home when on meds, SBP was 130s-140s, and since being off SBP 160s-180s, DBP 90-100s. Denies vision changes, headaches, numbness, tingling, chest pain/tightness, changes in speech or weakness.   Perimenopausal symptoms - will get hot flashes every night, not overly bothersome to pt. Does not feel like its ruining her daily tasks. LMP 06/06/23 - lasted 6-7 days, normal flow and normal cramping. Has not had period since. Has had 3-4 periods this year. Does feel like she has gained more weight and is eating more since having these symptoms.  BC- tubal ligation.  Dizziness - intermittent since late October 2024. She is not sure if it is due to her BP or she is having vertigo, states she is taking dramamine which helps, but makes her sleepy. She takes this 1-2x per week around bedtime. States she  does not feel like she is going to passed out, but feels like room is spinning, like she has the spins. Happens when she is moving around at work, moving in bed, or randomly, will last a few minutes. Denies hearing changes, no other focal neuro changes.   Outpatient Encounter Medications as of 10/09/2023  Medication Sig   amLODipine (NORVASC) 2.5 MG tablet Take 1 tablet (2.5 mg total) by mouth daily.   meclizine (ANTIVERT) 12.5 MG tablet Take 1 tablet (12.5 mg total) by mouth 3 (three) times daily as needed for dizziness.   acetaminophen-codeine (TYLENOL #3) 300-30 MG per tablet Take 1-2 tablets by mouth every 6 (six) hours as needed for moderate pain. (Patient not taking: Reported on 04/03/2021)   ibuprofen (ADVIL,MOTRIN) 800 MG tablet Take 1 tablet (800 mg total) by mouth every 8 (eight) hours as needed. (Patient not taking: Reported on 04/03/2021)   Prenat w/o A Vit-FeFum-FePo-FA (PROVIDA OB) 20-20-1.25 MG CAPS Take 1 capsule by mouth daily. (Patient not taking: Reported on 04/03/2021)   No facility-administered encounter medications on file as of 10/09/2023.    Past Medical History:  Diagnosis Date   Anxiety    Arthritis    Depression    Hypertension     Past Surgical History:  Procedure Laterality Date   TUBAL LIGATION N/A 05/09/2015   Procedure: POST PARTUM TUBAL LIGATION;  Surgeon: Hildred Laser, MD;  Location: [facility] ORS;  Service: Gynecology;  Laterality: N/A;  Family History  Problem Relation Age of Onset   Thyroid disease Mother    Calcium disorder Mother     Social History   Socioeconomic History   Marital status: Single    Spouse name: Not on file   Number of children: Not on file   Years of education: Not on file   Highest education level: Not on file  Occupational History   Not on file  Tobacco Use   Smoking status: Former    Current packs/day: 0.25    Types: Cigarettes   Smokeless tobacco: Never  Vaping Use   Vaping status: Every Day  Substance and Sexual  Activity   Alcohol use: No   Drug use: Yes    Types: Marijuana    Comment: vape pen   Sexual activity: Yes    Partners: Male    Birth control/protection: Surgical  Other Topics Concern   Not on file  Social History Narrative   Not on file   Social Drivers of Health   Financial Resource Strain: Not on file  Food Insecurity: Not on file  Transportation Needs: Not on file  Physical Activity: Not on file  Stress: Not on file  Social Connections: Not on file  Intimate Partner Violence: Not on file    Review of Systems  All other systems reviewed and are negative.    Objective    BP (!) 174/103 (BP Location: Left Arm, Patient Position: Sitting, Cuff Size: Normal)   Pulse 80   Temp 97.8 F (36.6 C)   Resp 16   Ht 5\' 5"  (1.651 m)   Wt 227 lb 11.2 oz (103.3 kg)   SpO2 100%   BMI 37.89 kg/m   Physical Exam Constitutional:      Appearance: Normal appearance. She is obese.  HENT:     Head: Normocephalic.     Right Ear: Tympanic membrane normal.     Left Ear: Tympanic membrane normal.  Eyes:     Extraocular Movements: Extraocular movements intact.     Conjunctiva/sclera: Conjunctivae normal.     Pupils: Pupils are equal, round, and reactive to light.  Neck:     Thyroid: No thyroid mass or thyromegaly.  Cardiovascular:     Rate and Rhythm: Normal rate and regular rhythm.     Pulses: Normal pulses.     Heart sounds: Normal heart sounds. No murmur heard.    No friction rub. No gallop.  Pulmonary:     Effort: No respiratory distress.     Breath sounds: No stridor. No wheezing, rhonchi or rales.  Chest:     Chest wall: No tenderness.  Musculoskeletal:     Cervical back: No tenderness.     Right lower leg: No edema.     Left lower leg: No edema.  Lymphadenopathy:     Cervical: No cervical adenopathy.  Skin:    General: Skin is warm and dry.     Capillary Refill: Capillary refill takes less than 2 seconds.  Neurological:     General: No focal deficit present.      Mental Status: She is alert and oriented to person, place, and time. Mental status is at baseline.         Assessment & Plan:   Encounter to establish care with new doctor Assessment & Plan: Welcome to [facility] checked Problems addressed today: HTN, dizziness, tobacco use, and perimenopausal symptoms. Deferred mammogram today   Primary hypertension Assessment & Plan: Uncontrolled BP, appears to chronically be  in 160s-170s Attempted med mgmt with lisinopril and hydrochlorothiazide in November, but felt horrible. Potentially due to decreasing BP to fast causing less normal perfusion to end organs than her body is used to.  - Will start low with amlodipine 2.5 mg po daily - Monitor BP daily - Goal SBP<130, DBP<80 - DASH diet, exercise, decrease sugary drinks - Tobacco cessation - Discussed eliminating NSAIDs until BP controlled - Discussed emergent BP symptoms and when to present to ED - f/u 2-3 weeks for BP check - CMP  Orders: -     Comprehensive metabolic panel -     amLODIPine Besylate; Take 1 tablet (2.5 mg total) by mouth daily.  Dispense: 30 tablet; Refill: 0  Dizziness Assessment & Plan: Intermittent dizziness, does not appear central nervous in nature, nor syncopal. HTN vs vertigo. Pt hx of uncontrolled BP, dizziness could be symptom of fluctuating BP. States has been taking dramamine 1-2x week - which helps, but makes her sleepy.  - Less likely vertigo, but will do short trial of meclizine as well. - Discussed Epley Maneuver - Increased water intake  Orders: -     Meclizine HCl; Take 1 tablet (12.5 mg total) by mouth 3 (three) times daily as needed for dizziness.  Dispense: 30 tablet; Refill: 0  Class 2 obesity with body mass index (BMI) of 37.0 to 37.9 in adult, unspecified obesity type, unspecified whether serious comorbidity present Assessment & Plan: Weight loss and exercise stressed - A1C - Lipid panel  Orders: -     Lipid panel -     Hemoglobin  A1c  Perimenopausal vasomotor symptoms Assessment & Plan: Managed currently, discussed if hot flashes become unbearable may discuss non-hormonal options, as estrogen but option given HTN as this time. - Tubal ligation hx - still intermittently getting periods.    Encounter for hepatitis C screening test for low risk patient -     Hepatitis C antibody  Tobacco abuse Assessment & Plan: Half pack per day or less, smoking for 26 years.  - Discussed cessation, willing to decrease to 5-6 per day. Is motivated to quit - Dicussed affect on BP and tobacco use.    Return in about 3 weeks (around 10/30/2023) for blood pressure check.   I, Sallee Provencal, FNP, have reviewed all documentation for this visit. The documentation on 10/09/23 for the exam, diagnosis, procedures, and orders are all accurate and complete.   Sallee Provencal, FNP

## 2023-10-10 LAB — COMPREHENSIVE METABOLIC PANEL
ALT: 19 [IU]/L (ref 0–32)
AST: 18 [IU]/L (ref 0–40)
Albumin: 4.2 g/dL (ref 3.9–4.9)
Alkaline Phosphatase: 115 [IU]/L (ref 44–121)
BUN/Creatinine Ratio: 19 (ref 9–23)
BUN: 12 mg/dL (ref 6–24)
Bilirubin Total: 0.2 mg/dL (ref 0.0–1.2)
CO2: 24 mmol/L (ref 20–29)
Calcium: 9.7 mg/dL (ref 8.7–10.2)
Chloride: 105 mmol/L (ref 96–106)
Creatinine, Ser: 0.64 mg/dL (ref 0.57–1.00)
Globulin, Total: 3.1 g/dL (ref 1.5–4.5)
Glucose: 74 mg/dL (ref 70–99)
Potassium: 4.1 mmol/L (ref 3.5–5.2)
Sodium: 142 mmol/L (ref 134–144)
Total Protein: 7.3 g/dL (ref 6.0–8.5)
eGFR: 114 mL/min/{1.73_m2} (ref >59)

## 2023-10-10 LAB — HEMOGLOBIN A1C
Est. average glucose Bld gHb Est-mCnc: 114 mg/dL
Hgb A1c MFr Bld: 5.6 % (ref 4.8–5.6)

## 2023-10-10 LAB — LIPID PANEL
Chol/HDL Ratio: 3.6 {ratio} (ref 0.0–4.4)
Cholesterol, Total: 238 mg/dL — ABNORMAL HIGH (ref 100–199)
HDL: 66 mg/dL (ref >39)
LDL Chol Calc (NIH): 145 mg/dL — ABNORMAL HIGH (ref 0–99)
Triglycerides: 154 mg/dL — ABNORMAL HIGH (ref 0–149)
VLDL Cholesterol Cal: 27 mg/dL (ref 5–40)

## 2023-10-10 LAB — HEPATITIS C ANTIBODY: Hep C Virus Ab: NONREACTIVE

## 2023-10-12 ENCOUNTER — Encounter: Payer: Self-pay | Admitting: Family Medicine

## 2023-10-13 ENCOUNTER — Other Ambulatory Visit: Payer: Self-pay | Admitting: Family Medicine

## 2023-10-13 ENCOUNTER — Encounter: Payer: Self-pay | Admitting: Family Medicine

## 2023-10-13 DIAGNOSIS — E782 Mixed hyperlipidemia: Secondary | ICD-10-CM

## 2023-10-13 MED ORDER — ROSUVASTATIN CALCIUM 5 MG PO TABS: 5.0000 mg | ORAL_TABLET | Freq: Every day | ORAL | 3 refills | Status: DC

## 2023-10-30 ENCOUNTER — Ambulatory Visit: Payer: Medicaid Other | Admitting: Family Medicine

## 2023-10-30 ENCOUNTER — Encounter: Payer: Self-pay | Admitting: Family Medicine

## 2023-10-30 VITALS — BP 145/102 | HR 77 | Temp 98.0°F | Resp 16 | Ht 66.0 in | Wt 219.1 lb

## 2023-10-30 DIAGNOSIS — R42 Dizziness and giddiness: Secondary | ICD-10-CM | POA: Diagnosis not present

## 2023-10-30 DIAGNOSIS — I10 Essential (primary) hypertension: Secondary | ICD-10-CM

## 2023-10-30 DIAGNOSIS — E782 Mixed hyperlipidemia: Secondary | ICD-10-CM | POA: Insufficient documentation

## 2023-10-30 DIAGNOSIS — M25512 Pain in left shoulder: Secondary | ICD-10-CM | POA: Insufficient documentation

## 2023-10-30 MED ORDER — AMLODIPINE BESYLATE 5 MG PO TABS: 5.0000 mg | ORAL_TABLET | Freq: Every day | ORAL | 0 refills | Status: DC

## 2023-10-30 NOTE — Progress Notes (Signed)
Established Patient Office Visit  Introduced to nurse practitioner role and practice setting.  All questions answered.  Discussed provider/patient relationship and expectations.  Subjective   Patient ID: Kendra Patel, female    DOB: 06-May-1981  Age: 43 y.o. MRN: [id number]  Chief Complaint  Patient presents with   Hypertension    Just a blood pressure check     Pt presents for 4 week w/u on BP check. Was started on amlodipine 2.5 mg daily. States has been checking BP at home and SBP 130s-140s and DBP 80s-90s. Has decreased sugar intake, improving on lower salt intake, and has been moving/exercising more with daughter.Has lost 8 lbs, dizziness has improved, only once per week, still using meclizine intermittently.   Started taking crestor 5 mg - minor aches, but takes at night which has helped.   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   Hypertension This is a chronic problem. The current episode started more than 1 month ago. The problem has been gradually improving since onset. The problem is resistant. There are no associated agents to hypertension. Risk factors for coronary artery disease include dyslipidemia, family history, obesity, post-menopausal state, sedentary lifestyle, smoking/tobacco exposure and stress. Past treatments include calcium channel blockers. The current treatment provides mild improvement. There are no compliance problems.         10/30/2023    8:47 AM 10/30/2023    8:28 AM 10/09/2023    2:02 PM  Depression screen PHQ 2/9  Decreased Interest 1 1 1   Down, Depressed, Hopeless 1 1 1   PHQ - 2 Score 2 2 2   Altered sleeping 0 0 2  Tired, decreased energy 1 1 2   Change in appetite 0 0 3  Feeling bad or failure about yourself  1 1 0  Trouble concentrating 0 0 0  Moving slowly or fidgety/restless 0 0 0  Suicidal thoughts 0 0 0  PHQ-9 Score 4 4 9   Difficult doing work/chores Not difficult at all Not difficult at all Somewhat  difficult       10/30/2023    8:48 AM  GAD 7 : Generalized Anxiety Score  Nervous, Anxious, on Edge 0  Control/stop worrying 1  Worry too much - different things 1  Trouble relaxing 0  Restless 0  Easily annoyed or irritable 1  Afraid - awful might happen 2  Total GAD 7 Score 5  Anxiety Difficulty Not difficult at all     ROS  Negative unless indicated in HPI   Objective:     BP (!) 145/102 (BP Location: Right Arm, Patient Position: Sitting, Cuff Size: Normal)   Pulse 77   Temp 98 F (36.7 C)   Resp 16   Ht 5\' 6"  (1.676 m)   Wt 219 lb 1.6 oz (99.4 kg)   SpO2 100%   BMI 35.36 kg/m    Physical Exam Constitutional:      General: She is not in acute distress.    Appearance: Normal appearance. She is obese. She is not toxic-appearing or diaphoretic.  HENT:     Head: Normocephalic.     Nose: Nose normal.     Mouth/Throat:     Mouth: Mucous membranes are moist.     Pharynx: Oropharynx is clear.  Eyes:     Extraocular Movements: Extraocular movements intact.     Pupils: Pupils are equal, round, and reactive to light.  Cardiovascular:     Rate and Rhythm: Normal rate and  regular rhythm.     Pulses: Normal pulses.     Heart sounds: Normal heart sounds. No murmur heard.    No friction rub. No gallop.  Pulmonary:     Effort: No respiratory distress.     Breath sounds: No stridor. No wheezing, rhonchi or rales.  Chest:     Chest wall: No tenderness.  Musculoskeletal:     Right lower leg: No edema.     Left lower leg: No edema.  Skin:    General: Skin is warm and dry.     Capillary Refill: Capillary refill takes less than 2 seconds.  Neurological:     General: No focal deficit present.     Mental Status: She is alert and oriented to person, place, and time. Mental status is at baseline.  Psychiatric:        Mood and Affect: Mood normal.        Behavior: Behavior normal.        Thought Content: Thought content normal.        Judgment: Judgment normal.      No results found for any visits on 10/30/23.    The 10-year ASCVD risk score (Arnett DK, et al., 2019) is: 4.8%    Assessment & Plan:  Primary hypertension Assessment & Plan: Chronic, more stable today compared to last visit= 145/102. - Increase amlodipine to 5mg  daily from 2.5 - Monitor BP daily - Goal SBP<130, DBP<80 - DASH diet, exercise, decrease sugary drinks - Tobacco cessation - Continue to limit NSAIDS - Discussed emergent BP symptoms and when to present to ED - f/u 6 weeks for BP check - may need second agent depending on value  Orders: -     amLODIPine Besylate; Take 1 tablet (5 mg total) by mouth daily.  Dispense: 90 tablet; Refill: 0  Mixed hyperlipidemia Assessment & Plan: Started on Crestor 5 mg based on labs Pt is taking - continue Plan to recheck lipids at next visit.  Continue low saturated diet fat, weight loss, and exercise   Left shoulder pain, unspecified chronicity Assessment & Plan: Intermittent L shoulder pain Full ROM, Full strength in exam, appear more muscle related rather than bone. 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    Dizziness Assessment & Plan: Improved from previous visit - Episodes only once a week now, and only taking meclizine once every week or two - Continue BP management - Continue to increase daily water intake - If continues at next visit, may need further work up, will reassess. Does not appear central nervous or cardiac in nature at this time.     Return in about 6 weeks (around 12/11/2023) for BP check.   I, Sallee Provencal, FNP, have reviewed all documentation for this visit. The documentation on 10/30/23 for the exam, diagnosis, procedures, and orders are all accurate and complete.    Sallee Provencal, FNP

## 2023-10-30 NOTE — Assessment & Plan Note (Signed)
Improved from previous visit - Episodes only once a week now, and only taking meclizine once every week or two - Continue BP management - Continue to increase daily water intake - If continues at next visit, may need further work up, will reassess. Does not appear central nervous or cardiac in nature at this time.

## 2023-10-30 NOTE — Assessment & Plan Note (Signed)
Started on Crestor 5 mg based on labs Pt is taking - continue Plan to recheck lipids at next visit.  Continue low saturated diet fat, weight loss, and exercise

## 2023-10-30 NOTE — Assessment & Plan Note (Signed)
Intermittent L shoulder pain Full ROM, Full strength in exam, appear more muscle related rather than bone. 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

## 2023-10-30 NOTE — Assessment & Plan Note (Signed)
Chronic, more stable today compared to last visit= 145/102. - Increase amlodipine to 5mg  daily from 2.5 - Monitor BP daily - Goal SBP<130, DBP<80 - DASH diet, exercise, decrease sugary drinks - Tobacco cessation - Continue to limit NSAIDS - Discussed emergent BP symptoms and when to present to ED - f/u 6 weeks for BP check - may need second agent depending on value

## 2023-11-26 ENCOUNTER — Encounter: Payer: Self-pay | Admitting: Family Medicine

## 2023-12-10 ENCOUNTER — Encounter: Payer: Self-pay | Admitting: Family Medicine

## 2023-12-10 ENCOUNTER — Ambulatory Visit: Payer: Medicaid Other | Admitting: Family Medicine

## 2023-12-10 VITALS — BP 120/88 | HR 75 | Ht 66.0 in | Wt 217.8 lb

## 2023-12-10 DIAGNOSIS — E782 Mixed hyperlipidemia: Secondary | ICD-10-CM

## 2023-12-10 DIAGNOSIS — Z720 Tobacco use: Secondary | ICD-10-CM

## 2023-12-10 DIAGNOSIS — I10 Essential (primary) hypertension: Secondary | ICD-10-CM

## 2023-12-10 MED ORDER — AMLODIPINE BESYLATE 5 MG PO TABS
5.0000 mg | ORAL_TABLET | Freq: Every day | ORAL | 2 refills | Status: DC
Start: 2023-12-10 — End: 2024-01-20

## 2023-12-10 NOTE — Progress Notes (Signed)
 ,  Established Patient Office Visit  Subjective   Patient ID: Kendra Patel, female    DOB: Feb 01, 1981  Age: 43 y.o. MRN: [id number]  Chief Complaint  Patient presents with   Follow-up    6 wk follow up for BP    Kendra Patel is a 43 year old female with hypertension who presents for blood pressure management.  HTN - historically BP 160s-170s/ 100s. She attributes some fluctuations to dietary choices, including processed foods and high-sodium meals, particularly around events like her daughter's birthday and the Super Bowl. She monitors her blood pressure more frequently on weekends and occasionally during the week. She is currently taking amlodipine 5 mg at night. She is attempting to improve her lifestyle by reducing smoking and making healthier dietary choices, such as eating nuts and drinking smoothies.  She reports significant muscle pain associated with the use of rosuvastatin (Crestor), which she started in December. She describes severe shoulder pain that later moved to her chest, making it painful to shower and laugh. She stopped taking Crestor a week ago due to these side effects and has been using ibuprofen and Icy Hot for pain relief. She also experienced vivid dreams while on the medication.  She has reduced her smoking significantly, now smoking only a few cigarettes a day. She does not use marijuana or THC pens anymore. She is motivated to continue making lifestyle changes, including increasing physical activity as the weather improves. No swelling, and she has been drinking water and trying to eat well.        12/10/2023    8:57 AM 10/30/2023    8:47 AM 10/30/2023    8:28 AM  Depression screen PHQ 2/9  Decreased Interest 0 1 1  Down, Depressed, Hopeless 1 1 1   PHQ - 2 Score 1 2 2   Altered sleeping 0 0 0  Tired, decreased energy 1 1 1   Change in appetite 0 0 0  Feeling bad or failure about yourself  1 1 1   Trouble concentrating 0 0 0  Moving slowly or  fidgety/restless 0 0 0  Suicidal thoughts 0 0 0  PHQ-9 Score 3 4 4   Difficult doing work/chores Not difficult at all Not difficult at all Not difficult at all       12/10/2023    8:58 AM 10/30/2023    8:48 AM  GAD 7 : Generalized Anxiety Score  Nervous, Anxious, on Edge 1 0  Control/stop worrying 1 1  Worry too much - different things 1 1  Trouble relaxing 0 0  Restless 0 0  Easily annoyed or irritable 1 1  Afraid - awful might happen 2 2  Total GAD 7 Score 6 5  Anxiety Difficulty Not difficult at all Not difficult at all     Review of Systems  All other systems reviewed and are negative.   Negative unless indicated in HPI   Objective:     BP 120/88   Pulse 75   Ht 5\' 6"  (1.676 m)   Wt 217 lb 12.8 oz (98.8 kg)   SpO2 100%   BMI 35.15 kg/m    Physical Exam Constitutional:      General: She is not in acute distress.    Appearance: Normal appearance. She is overweight. She is not toxic-appearing or diaphoretic.  HENT:     Head: Normocephalic.     Nose: Nose normal.     Mouth/Throat:     Mouth: Mucous membranes are moist.  Pharynx: Oropharynx is clear.  Eyes:     General:        Right eye: No discharge.        Left eye: No discharge.     Extraocular Movements: Extraocular movements intact.     Pupils: Pupils are equal, round, and reactive to light.  Cardiovascular:     Rate and Rhythm: Normal rate and regular rhythm.     Pulses: Normal pulses.     Heart sounds: Normal heart sounds. No murmur heard.    No friction rub. No gallop.  Pulmonary:     Effort: Pulmonary effort is normal. No respiratory distress.     Breath sounds: Normal breath sounds. No stridor. No wheezing, rhonchi or rales.  Chest:     Chest wall: No tenderness.  Abdominal:     Tenderness: There is no right CVA tenderness or left CVA tenderness.  Musculoskeletal:     Right lower leg: No edema.     Left lower leg: No edema.  Skin:    General: Skin is warm and dry.     Capillary Refill:  Capillary refill takes less than 2 seconds.  Neurological:     General: No focal deficit present.     Mental Status: She is alert and oriented to person, place, and time. Mental status is at baseline.  Psychiatric:        Mood and Affect: Mood normal.        Behavior: Behavior normal.        Thought Content: Thought content normal.        Judgment: Judgment normal.    No results found for any visits on 12/10/23.    The 10-year ASCVD risk score (Arnett DK, et al., 2019) is: 1.8%    Assessment & Plan:  Primary hypertension Assessment & Plan: Improved blood pressure control with Amlodipine 5mg  daily.  BP today: 120/88 Patient reports lifestyle changes including reduced smoking and improved diet.  Noted some variability in blood pressure readings, potentially related to diet and smoking habits. -Continue Amlodipine 5mg  daily. -Encourage continued lifestyle modifications including smoking cessation and healthy diet. DASH diet -Check blood pressure weekly. -f/u 4 months for BP checl - Keep up great work!  Orders: -     amLODIPine Besylate; Take 1 tablet (5 mg total) by mouth daily.  Dispense: 90 tablet; Refill: 2  Mixed hyperlipidemia Assessment & Plan: Patient experienced myalgias with Crestor.  Stopped medication one week ago.  Discussed options for alternative statins or non-statin cholesterol-lowering medication. -Repeat lipid panel fasting today. - Discussed changing to different statin or cholesterol lowering agent based on results Continue lifestyle changes, decrease processed and saturated foods.   Orders: -     Lipid panel  Tobacco abuse Assessment & Plan: Improving, pt is down to 3 cigarettes per day, today has had none Continuing to work on eliminating - very motivated     Return in about 4 months (around 04/08/2024) for annual physical and BP checks.    Sallee Provencal, FNP

## 2023-12-10 NOTE — Assessment & Plan Note (Signed)
 Improved blood pressure control with Amlodipine 5mg  daily.  BP today: 120/88 Patient reports lifestyle changes including reduced smoking and improved diet.  Noted some variability in blood pressure readings, potentially related to diet and smoking habits. -Continue Amlodipine 5mg  daily. -Encourage continued lifestyle modifications including smoking cessation and healthy diet. DASH diet -Check blood pressure weekly. -f/u 4 months for BP checl - Keep up great work!

## 2023-12-10 NOTE — Assessment & Plan Note (Signed)
 Improving, pt is down to 3 cigarettes per day, today has had none Continuing to work on eliminating - very motivated

## 2023-12-10 NOTE — Assessment & Plan Note (Signed)
 Patient experienced myalgias with Crestor.  Stopped medication one week ago.  Discussed options for alternative statins or non-statin cholesterol-lowering medication. -Repeat lipid panel fasting today. - Discussed changing to different statin or cholesterol lowering agent based on results Continue lifestyle changes, decrease processed and saturated foods.

## 2023-12-11 ENCOUNTER — Encounter: Payer: Self-pay | Admitting: Family Medicine

## 2023-12-11 LAB — LIPID PANEL
Chol/HDL Ratio: 2.9 ratio (ref 0.0–4.4)
Cholesterol, Total: 176 mg/dL (ref 100–199)
HDL: 61 mg/dL (ref >39)
LDL Chol Calc (NIH): 105 mg/dL — ABNORMAL HIGH (ref 0–99)
Triglycerides: 48 mg/dL (ref 0–149)
VLDL Cholesterol Cal: 10 mg/dL (ref 5–40)

## 2024-01-20 ENCOUNTER — Telehealth

## 2024-01-20 DIAGNOSIS — I10 Essential (primary) hypertension: Secondary | ICD-10-CM | POA: Diagnosis not present

## 2024-01-20 MED ORDER — AMLODIPINE BESYLATE 10 MG PO TABS: 10.0000 mg | ORAL_TABLET | Freq: Every day | ORAL | 0 refills | Status: DC

## 2024-01-20 MED ORDER — HYDROCHLOROTHIAZIDE 12.5 MG PO TABS: 12.5000 mg | ORAL_TABLET | Freq: Every day | ORAL | 0 refills | Status: DC

## 2024-01-20 NOTE — Progress Notes (Signed)
 Virtual Visit Consent   Kendra Patel, you are scheduled for a virtual visit with a [facility] provider today. Just as with appointments in the office, your consent must be obtained to participate. Your consent will be active for this visit and any virtual visit you may have with one of our providers in the next 365 days. If you have a MyChart account, a copy of this consent can be sent to you electronically.  As this is a virtual visit, video technology does not allow for your provider to perform a traditional examination. This may limit your provider's ability to fully assess your condition. If your provider identifies any concerns that need to be evaluated in person or the need to arrange testing (such as labs, EKG, etc.), we will make arrangements to do so. Although advances in technology are sophisticated, we cannot ensure that it will always work on either your end or our end. If the connection with a video visit is poor, the visit may have to be switched to a telephone visit. With either a video or telephone visit, we are not always able to ensure that we have a secure connection.  By engaging in this virtual visit, you consent to the provision of healthcare and authorize for your insurance to be billed (if applicable) for the services provided during this visit. Depending on your insurance coverage, you may receive a charge related to this service.  I need to obtain your verbal consent now. Are you willing to proceed with your visit today? Kendra Patel has provided verbal consent on 01/20/2024 for a virtual visit (video or telephone). Piedad Climes, [place]  Date: 01/20/2024 9:13 AM   Virtual Visit via Video Note   I, Piedad Climes, connected with  Kendra Patel  ([id number], 04-04-1981) on 01/20/24 at  9:00 AM EDT by a video-enabled telemedicine application and verified that I am speaking with the correct person using two identifiers.  Location: Patient: Virtual Visit  Location Patient: Home Provider: Virtual Visit Location Provider: Home Office   I discussed the limitations of evaluation and management by telemedicine and the availability of in person appointments. The patient expressed understanding and agreed to proceed.    History of Present Illness: Kendra Patel is a 43 y.o. who identifies as a female who was assigned female at birth, and is being seen today for elevation in her blood pressure.  Patient with history of primary hypertension, currently on a regimen of amlodipine 5 mg daily.  Notes this has been controlling her blood pressure levels up until recently.  Has noted blood pressure climbing up to levels as high as 170/105.  Has on her own increased her amlodipine to 10 mg daily.  Notes some improvement but still with elevation in blood pressure.  This morning blood pressure at 141/97.  Heart rate 83 bpm.  Notes she can tell when her blood pressure is getting elevated because she will get slightly dizzy.  Notes this has always been the case for her.  Denies chest pain, shortness of breath, palpitations.  Notes she was trying to get an appointment with her primary care provider, but she is currently out of office until the end of May for maternity leave.  As such, was unsure what to do.  She does note that she was not eating the best a few weeks ago while dealing with a upper respiratory infection.  Since then, has returned to her normal diet.  Is hydrating well.  Notes she is a smoker but has decreased from 9 cigarettes/day to around 4/day.  BP Readings from Last 3 Encounters:  12/10/23 120/88  10/30/23 (!) 145/102  10/09/23 (!) 174/103     HPI: HPI  Problems:  Patient Active Problem List   Diagnosis Date Noted   Mixed hyperlipidemia 10/30/2023   Left shoulder pain 10/30/2023   Primary hypertension 10/09/2023   Encounter to establish care with new doctor 10/09/2023   Dizziness 10/09/2023   Cigarette nicotine dependence with nicotine-induced  disorder 10/09/2023   Perimenopausal vasomotor symptoms 10/09/2023   NSVD (normal spontaneous vaginal delivery) 05/10/2015   S/P tubal ligation 05/10/2015   Normal labor 05/08/2015   Class 2 obesity with body mass index (BMI) of 37.0 to 37.9 in adult 03/14/2015   H/O trichomoniasis 03/14/2015   Sciatic leg pain 03/14/2015   Hypertension in pregnancy, pre-existing 03/14/2015   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  Resting comfortably  at home.  Head is normocephalic, atraumatic.  No labored breathing. Speech is clear and coherent with logical content.  Patient is alert and oriented at baseline.   Assessment and Plan: 1. Primary hypertension (Primary)  Suspect multifactorial due to both intrinsic and extrinsic factors.  Biggest extrinsic risk factor at the moment would be her tobacco use.  Discussed risks of continued smoking and recommendations regarding cessation.  She will give some thought to this.  DASH diet reviewed and handout given.  Since she is currently taking 10 mg of amlodipine with some reduction in her blood pressure and normal heart rate, we will continue this.  New prescription sent into the pharmacy.  Will add on 12.5 mg of HCTZ to further get blood pressure back to normotensive levels.  Needs a follow-up in office with one of her primary care provider's colleagues, within 1 to 2 weeks.  In person urgent care for follow-up if unable to get  appointment at PCP office.  Strict ER precautions reviewed with patient.  Follow Up Instructions: I discussed the assessment and treatment plan with the patient. The patient was provided an opportunity to ask questions and all were answered. The patient agreed with the plan and demonstrated an understanding of the instructions.  A copy of instructions were sent to the patient via MyChart unless otherwise noted below.   The patient was advised to call back or seek an in-person evaluation if the symptoms worsen or if the condition fails to improve as anticipated.    Piedad Climes, PA-C

## 2024-01-20 NOTE — Patient Instructions (Signed)
 Kendra Patel, thank you for joining Piedad Climes, PA-C for today's virtual visit.  While this provider is not your primary care provider (PCP), if your PCP is located in our provider database this encounter information will be shared with them immediately following your visit.   A [facility] MyChart account gives you access to today's visit and all your visits, tests, and labs performed at [facility] " click here if you don't have a [facility] MyChart account or go to [email or web address]  Consent: (Patient) Kendra Patel provided verbal consent for this virtual visit at the beginning of the encounter.  Current Medications:  Current Outpatient Medications:    amLODipine (NORVASC) 5 MG tablet, Take 1 tablet (5 mg total) by mouth daily., Disp: 90 tablet, Rfl: 2   meclizine (ANTIVERT) 12.5 MG tablet, Take 1 tablet (12.5 mg total) by mouth 3 (three) times daily as needed for dizziness., Disp: 30 tablet, Rfl: 0   rosuvastatin (CRESTOR) 5 MG tablet, Take 1 tablet (5 mg total) by mouth daily. (Patient not taking: Reported on 12/10/2023), Disp: 90 tablet, Rfl: 3   Medications ordered in this encounter:  No orders of the defined types were placed in this encounter.    *If you need refills on other medications prior to your next appointment, please contact your pharmacy*  Follow-Up: Call back or seek an in-person evaluation if the symptoms worsen or if the condition fails to improve as anticipated.   Virtual Care [phone]  Other Instructions Please continue the amlodipine at 10 mg daily.  I have sent in a new prescription for you. Add on hydrochlorothiazide 12.5 mg once daily. Continue to follow a low-salt diet (see below). Continue to check blood pressure daily and record. Please call your primary care office to schedule follow-up within the next 10 to 12 days.  DASH Eating Plan DASH stands for Dietary Approaches to Stop Hypertension. The  DASH eating plan is a healthy eating plan that has been shown to: Lower high blood pressure (hypertension). Reduce your risk for type 2 diabetes, heart disease, and stroke. Help with weight loss. What are tips for following this plan? Reading food labels Check food labels for the amount of salt (sodium) per serving. Choose foods with less than 5 percent of the Daily Value (DV) of sodium. In general, foods with less than 300 milligrams (mg) of sodium per serving fit into this eating plan. To find whole grains, look for the word "whole" as the first word in the ingredient list. Shopping Buy products labeled as "low-sodium" or "no salt added." Buy fresh foods. Avoid canned foods and pre-made or frozen meals. Cooking Try not to add salt when you cook. Use salt-free seasonings or herbs instead of table salt or sea salt. Check with your health care provider or pharmacist before using salt substitutes. Do not fry foods. Cook foods in healthy ways, such as baking, boiling, grilling, roasting, or broiling. Cook using oils that are good for your heart. These include olive, canola, avocado, soybean, and sunflower oil. Meal planning  Eat a balanced diet. This should include: 4 or more servings of fruits and 4 or more servings of vegetables each day. Try to fill half of your plate with fruits and vegetables. 6-8 servings of whole grains each day. 6 or less servings of lean meat, poultry, or fish each day. 1 oz is 1 serving. A 3 oz (85 g) serving of meat is about the same size as the palm of  your hand. One egg is 1 oz (28 g). 2-3 servings of low-fat dairy each day. One serving is 1 cup (237 mL). 1 serving of nuts, seeds, or beans 5 times each week. 2-3 servings of heart-healthy fats. Healthy fats called omega-3 fatty acids are found in foods such as walnuts, flaxseeds, fortified milks, and eggs. These fats are also found in cold-water fish, such as sardines, salmon, and mackerel. Limit how much you eat  of: Canned or prepackaged foods. Food that is high in trans fat, such as fried foods. Food that is high in saturated fat, such as fatty meat. Desserts and other sweets, sugary drinks, and other foods with added sugar. Full-fat dairy products. Do not salt foods before eating. Do not eat more than 4 egg yolks a week. Try to eat at least 2 vegetarian meals a week. Eat more home-cooked food and less restaurant, buffet, and fast food. Lifestyle When eating at a restaurant, ask if your food can be made with less salt or no salt. If you drink alcohol: Limit how much you have to: 0-1 drink a day if you are female. 0-2 drinks a day if you are female. Know how much alcohol is in your drink. In the [place] one drink is one 12 oz bottle of beer (355 mL), one 5 oz glass of wine (148 mL), or one 1 oz glass of hard liquor (44 mL). General information Avoid eating more than 2,300 mg of salt a day. If you have hypertension, you may need to reduce your sodium intake to 1,500 mg a day. Work with your provider to stay at a healthy body weight or lose weight. Ask what the best weight range is for you. On most days of the week, get at least 30 minutes of exercise that causes your heart to beat faster. This may include walking, swimming, or biking. Work with your provider or dietitian to adjust your eating plan to meet your specific calorie needs. What foods should I eat? Fruits All fresh, dried, or frozen fruit. Canned fruits that are in their natural juice and do not have sugar added to them. Vegetables Fresh or frozen vegetables that are raw, steamed, roasted, or grilled. Low-sodium or reduced-sodium tomato and vegetable juice. Low-sodium or reduced-sodium tomato sauce and tomato paste. Low-sodium or reduced-sodium canned vegetables. Grains Whole-grain or whole-wheat bread. Whole-grain or whole-wheat pasta. Brown rice. Orpah Cobb. Bulgur. Whole-grain and low-sodium cereals. Pita bread. Low-fat, low-sodium  crackers. Whole-wheat flour tortillas. Meats and other proteins Skinless chicken or [place]. Ground chicken or [place]. Pork with fat trimmed off. Fish and seafood. Egg whites. Dried beans, peas, or lentils. Unsalted nuts, nut butters, and seeds. Unsalted canned beans. Lean cuts of beef with fat trimmed off. Low-sodium, lean precooked or cured meat, such as sausages or meat loaves. Dairy Low-fat (1%) or fat-free (skim) milk. Reduced-fat, low-fat, or fat-free cheeses. Nonfat, low-sodium ricotta or cottage cheese. Low-fat or nonfat yogurt. Low-fat, low-sodium cheese. Fats and oils Soft margarine without trans fats. Vegetable oil. Reduced-fat, low-fat, or light mayonnaise and salad dressings (reduced-sodium). Canola, safflower, olive, avocado, soybean, and sunflower oils. Avocado. Seasonings and condiments Herbs. Spices. Seasoning mixes without salt. Other foods Unsalted popcorn and pretzels. Fat-free sweets. The items listed above may not be all the foods and drinks you can have. Talk to a dietitian to learn more. What foods should I avoid? Fruits Canned fruit in a light or heavy syrup. Fried fruit. Fruit in cream or butter sauce. Vegetables Creamed or fried vegetables. Vegetables  in a cheese sauce. Regular canned vegetables that are not marked as low-sodium or reduced-sodium. Regular canned tomato sauce and paste that are not marked as low-sodium or reduced-sodium. Regular tomato and vegetable juices that are not marked as low-sodium or reduced-sodium. Rosita Fire. Olives. Grains Baked goods made with fat, such as croissants, muffins, or some breads. Dry pasta or rice meal packs. Meats and other proteins Fatty cuts of meat. Ribs. Fried meat. Tomasa Blase. Bologna, salami, and other precooked or cured meats, such as sausages or meat loaves, that are not lean and low in sodium. Fat from the back of a pig (fatback). Bratwurst. Salted nuts and seeds. Canned beans with added salt. Canned or smoked fish. Whole eggs  or egg yolks. Chicken or [place] with skin. Dairy Whole or 2% milk, cream, and half-and-half. Whole or full-fat cream cheese. Whole-fat or sweetened yogurt. Full-fat cheese. Nondairy creamers. Whipped toppings. Processed cheese and cheese spreads. Fats and oils Butter. Stick margarine. Lard. Shortening. Ghee. Bacon fat. Tropical oils, such as coconut, palm kernel, or palm oil. Seasonings and condiments Onion salt, garlic salt, seasoned salt, table salt, and sea salt. Worcestershire sauce. Tartar sauce. Barbecue sauce. Teriyaki sauce. Soy sauce, including reduced-sodium soy sauce. Steak sauce. Canned and packaged gravies. Fish sauce. Oyster sauce. Cocktail sauce. Store-bought horseradish. Ketchup. Mustard. Meat flavorings and tenderizers. Bouillon cubes. Hot sauces. Pre-made or packaged marinades. Pre-made or packaged taco seasonings. Relishes. Regular salad dressings. Other foods Salted popcorn and pretzels. The items listed above may not be all the foods and drinks you should avoid. Talk to a dietitian to learn more. Where to find more information National Heart, Lung, and Blood Institute (NHLBI): BuffaloDryCleaner.gl American Heart Association (AHA): heart.org Academy of Nutrition and Dietetics: eatright.org National Kidney Foundation (NKF): kidney.org This information is not intended to replace advice given to you by your health care provider. Make sure you discuss any questions you have with your health care provider. Document Revised: 10/16/2022 Document Reviewed: 10/16/2022 Elsevier Patient Education  2024 Elsevier Inc.   If you have been instructed to have an in-person evaluation today at a local Urgent Care facility, please use the link below. It will take you to a list of all of our available [facility], including address, phone number and hours of operation. Please do not delay care.  [facility]  If you or a family member do not have a primary care provider, use  the link below to schedule a visit and establish care. When you choose a [facility] primary care physician or advanced practice provider, you gain a long-term partner in health. Find a Primary Care Provider  Learn more about [employer]'s in-office and virtual care options: [facility] - Get Care Now

## 2024-02-01 ENCOUNTER — Encounter: Payer: Self-pay | Admitting: Family Medicine

## 2024-02-01 ENCOUNTER — Ambulatory Visit: Attending: Family Medicine

## 2024-02-01 ENCOUNTER — Ambulatory Visit (INDEPENDENT_AMBULATORY_CARE_PROVIDER_SITE_OTHER): Admitting: Family Medicine

## 2024-02-01 VITALS — BP 113/85 | HR 84 | Resp 16 | Ht 66.0 in | Wt 221.0 lb

## 2024-02-01 DIAGNOSIS — I10 Essential (primary) hypertension: Secondary | ICD-10-CM | POA: Diagnosis not present

## 2024-02-01 DIAGNOSIS — R002 Palpitations: Secondary | ICD-10-CM

## 2024-02-01 DIAGNOSIS — F411 Generalized anxiety disorder: Secondary | ICD-10-CM

## 2024-02-01 DIAGNOSIS — R42 Dizziness and giddiness: Secondary | ICD-10-CM

## 2024-02-01 MED ORDER — SERTRALINE HCL 50 MG PO TABS: 50.0000 mg | ORAL_TABLET | Freq: Every day | ORAL | 3 refills | Status: DC

## 2024-02-01 NOTE — Progress Notes (Signed)
 Established patient visit   Patient: Kendra Patel   DOB: [birth year]   43 y.o. Female  MRN: [id number] Visit Date: 02/01/2024  PCP: Tasia Farr, FNP   Today's healthcare provider: Aden Agreste, MD   Chief Complaint  Patient presents with   Medical Management of Chronic Issues    HTN follow up.   Subjective    HPI HPI     Medical Management of Chronic Issues    Additional comments: HTN follow up.      Last edited by Rosas, Joseline E, CMA on 02/01/2024  8:17 AM.       Discussed the use of AI scribe software for clinical note transcription with the patient, who gave verbal consent to proceed.  History of Present Illness   The patient, with a history of hypertension, presents with ongoing concerns about high blood pressure, despite current treatment with amlodipine  and hydrochlorothiazide . The patient reports persistent dizziness, which she believes is not adequately managed by amlodipine . She notes an improvement in dizziness after taking hydrochlorothiazide . The patient also describes episodes of palpitations, particularly at rest, which cause significant distress and anxiety. The patient reports a history of perimenopause and anxiety, which she believes may be contributing to her symptoms. The patient has made lifestyle changes, including reducing smoking and improving diet and exercise, but continues to struggle with these symptoms.          02/01/2024    8:21 AM 12/10/2023    8:58 AM 10/30/2023    8:48 AM  GAD 7 : Generalized Anxiety Score  Nervous, Anxious, on Edge 3 1 0  Control/stop worrying 3 1 1   Worry too much - different things 3 1 1   Trouble relaxing 2 0 0  Restless 1 0 0  Easily annoyed or irritable 3 1 1   Afraid - awful might happen 1 2 2   Total GAD 7 Score 16 6 5   Anxiety Difficulty Somewhat difficult Not difficult at all Not difficult at all        02/01/2024    8:20 AM 12/10/2023    8:57 AM 10/30/2023    8:47 AM 10/30/2023     8:28 AM 10/09/2023    2:02 PM  Depression screen PHQ 2/9  Decreased Interest 1 0 1 1 1   Down, Depressed, Hopeless 2 1 1 1 1   PHQ - 2 Score 3 1 2 2 2   Altered sleeping 2 0 0 0 2  Tired, decreased energy 3 1 1 1 2   Change in appetite 1 0 0 0 3  Feeling bad or failure about yourself  1 1 1 1  0  Trouble concentrating 0 0 0 0 0  Moving slowly or fidgety/restless 0 0 0 0 0  Suicidal thoughts 0 0 0 0 0  PHQ-9 Score 10 3 4 4 9   Difficult doing work/chores Somewhat difficult Not difficult at all Not difficult at all Not difficult at all Somewhat difficult     Medications: Outpatient Medications Prior to Visit  Medication Sig   amLODipine  (NORVASC ) 10 MG tablet Take 1 tablet (10 mg total) by mouth daily.   hydrochlorothiazide  (HYDRODIURIL ) 12.5 MG tablet Take 1 tablet (12.5 mg total) by mouth daily.   meclizine  (ANTIVERT ) 12.5 MG tablet Take 1 tablet (12.5 mg total) by mouth 3 (three) times daily as needed for dizziness.   No facility-administered medications prior to visit.    Review of Systems     Objective    BP 113/85 (  BP Location: Left Arm, Patient Position: Sitting, Cuff Size: Large)   Pulse 84   Resp 16   Ht 5\' 6"  (1.676 m)   Wt 221 lb (100.2 kg)   LMP 05/14/2023   SpO2 100%   BMI 35.67 kg/m    Physical Exam Vitals reviewed.  Constitutional:      General: She is not in acute distress.    Appearance: Normal appearance. She is well-developed. She is not diaphoretic.  HENT:     Head: Normocephalic and atraumatic.  Eyes:     General: No scleral icterus.    Conjunctiva/sclera: Conjunctivae normal.  Neck:     Thyroid: No thyromegaly.  Cardiovascular:     Rate and Rhythm: Normal rate and regular rhythm.     Heart sounds: Normal heart sounds. No murmur heard. Pulmonary:     Effort: Pulmonary effort is normal. No respiratory distress.     Breath sounds: Normal breath sounds. No wheezing, rhonchi or rales.  Musculoskeletal:     Cervical back: Neck supple.     Right  lower leg: No edema.     Left lower leg: No edema.  Lymphadenopathy:     Cervical: No cervical adenopathy.  Skin:    General: Skin is warm and dry.     Findings: No rash.  Neurological:     Mental Status: She is alert and oriented to person, place, and time. Mental status is at baseline.  Psychiatric:        Mood and Affect: Mood normal.        Behavior: Behavior normal.      EKG: NSR  No results found for any visits on 02/01/24.  Assessment & Plan     Problem List Items Addressed This Visit       Cardiovascular and Mediastinum   Primary hypertension     Other   Dizziness   Other Visit Diagnoses       Palpitations    -  Primary   Relevant Orders   EKG 12-Lead   LONG TERM MONITOR (3-14 DAYS)   Comprehensive metabolic panel with GFR   CBC   TSH     GAD (generalized anxiety disorder)       Relevant Medications   sertraline  (ZOLOFT ) 50 MG tablet          Hypertension Hypertension diagnosed in November, managed with amlodipine  10 mg and hydrochlorothiazide  12.5 mg. Reports persistent dizziness and fluctuating blood pressure readings, with systolic BP as low as 95 and diastolic BP between 105-125. Experiences dizziness, especially when blood pressure is high. Amlodipine  alone does not control symptoms effectively, but hydrochlorothiazide  seems to help. Current regimen is amlodipine  in the morning and hydrochlorothiazide  at noon. Satisfied with current blood pressure readings but acknowledges discomfort with symptoms. Discussed that some individuals require more than one medication to manage hypertension effectively and that genetics may play a role in her condition. - Continue amlodipine  10 mg daily. - Continue hydrochlorothiazide  12.5 mg daily. - Monitor blood pressure regularly.  Palpitations Reports episodes of heart racing and palpitations, particularly at rest, often associated with anxiety. Symptoms include chest soreness following episodes. No prior workup for  palpitations. Suspects anxiety and perimenopausal changes may contribute to symptoms. Differential includes anxiety-induced palpitations versus arrhythmia. Explained that neither amlodipine  nor hydrochlorothiazide  control heart rate, and palpitations may be a separate issue from blood pressure. Discussed the need for further evaluation to rule out arrhythmia and determine if anxiety is the primary cause. - Perform EKG today. -  Order Ziopatch for 14-day heart rhythm monitoring. - Order blood work including kidney and liver function, electrolytes, blood counts, and thyroid function tests.  Anxiety Reports significant anxiety, exacerbated by perimenopausal symptoms, contributing to palpitations and dizziness. No prior treatment for anxiety. Discussed the potential benefit of treating anxiety to alleviate symptoms and improve quality of life. Explained that sertraline  can help smooth emotional fluctuations and is taken daily to maintain a steady state. Informed about potential side effects, including nausea and rare risk of suicidal thoughts, particularly in teens. Emphasized the importance of monitoring for side effects and adjusting treatment as needed. - Start sertraline  50 mg daily. - Discuss potential side effects of sertraline , including nausea and rare risk of suicidal thoughts. - Schedule follow-up in 6 weeks to assess response to sertraline  and adjust dosage if necessary.  Perimenopause In perimenopause, has not had a period in 8 months, and reports associated symptoms such as anxiety and palpitations. Acknowledges the interplay between perimenopausal changes and anxiety symptoms. Discussed that hormonal changes during perimenopause could contribute to both anxiety and palpitations. - Monitor symptoms and consider further management if symptoms persist or worsen.        Return in about 6 weeks (around 03/14/2024) for MDD/GAD f/u, BP f/u.       Aden Agreste, MD  [facility] [phone] (phone) [phone] (fax)  [facility]

## 2024-02-02 ENCOUNTER — Encounter: Payer: Self-pay | Admitting: Family Medicine

## 2024-02-02 LAB — CBC
Hematocrit: 40.1 % (ref 34.0–46.6)
Hemoglobin: 13.2 g/dL (ref 11.1–15.9)
MCH: 29.4 pg (ref 26.6–33.0)
MCHC: 32.9 g/dL (ref 31.5–35.7)
MCV: 89 fL (ref 79–97)
Platelets: 241 10*3/uL (ref 150–450)
RBC: 4.49 x10E6/uL (ref 3.77–5.28)
RDW: 12 % (ref 11.7–15.4)
WBC: 5 10*3/uL (ref 3.4–10.8)

## 2024-02-02 LAB — COMPREHENSIVE METABOLIC PANEL WITH GFR
ALT: 19 IU/L (ref 0–32)
AST: 19 IU/L (ref 0–40)
Albumin: 4.3 g/dL (ref 3.9–4.9)
Alkaline Phosphatase: 123 IU/L — ABNORMAL HIGH (ref 44–121)
BUN/Creatinine Ratio: 32 — ABNORMAL HIGH (ref 9–23)
BUN: 23 mg/dL (ref 6–24)
Bilirubin Total: <0.2 mg/dL (ref 0.0–1.2)
CO2: 24 mmol/L (ref 20–29)
Calcium: 9.8 mg/dL (ref 8.7–10.2)
Chloride: 103 mmol/L (ref 96–106)
Creatinine, Ser: 0.72 mg/dL (ref 0.57–1.00)
Globulin, Total: 3.2 g/dL (ref 1.5–4.5)
Glucose: 82 mg/dL (ref 70–99)
Potassium: 3.9 mmol/L (ref 3.5–5.2)
Sodium: 139 mmol/L (ref 134–144)
Total Protein: 7.5 g/dL (ref 6.0–8.5)
eGFR: 107 mL/min/{1.73_m2} (ref >59)

## 2024-02-02 LAB — TSH: TSH: 1.65 u[IU]/mL (ref 0.450–4.500)

## 2024-02-16 ENCOUNTER — Other Ambulatory Visit: Payer: Self-pay

## 2024-02-16 ENCOUNTER — Encounter: Payer: Self-pay | Admitting: Family Medicine

## 2024-02-16 DIAGNOSIS — I10 Essential (primary) hypertension: Secondary | ICD-10-CM

## 2024-02-16 MED ORDER — AMLODIPINE BESYLATE 10 MG PO TABS: 10.0000 mg | ORAL_TABLET | Freq: Every day | ORAL | 0 refills | Status: DC

## 2024-02-16 MED ORDER — HYDROCHLOROTHIAZIDE 12.5 MG PO TABS: 12.5000 mg | ORAL_TABLET | Freq: Every day | ORAL | 0 refills | Status: DC

## 2024-02-23 ENCOUNTER — Other Ambulatory Visit: Payer: Self-pay | Admitting: Family Medicine

## 2024-03-01 DIAGNOSIS — R002 Palpitations: Secondary | ICD-10-CM | POA: Diagnosis not present

## 2024-03-04 ENCOUNTER — Ambulatory Visit: Payer: Self-pay | Admitting: Family Medicine

## 2024-03-10 ENCOUNTER — Other Ambulatory Visit: Payer: Self-pay | Admitting: Family Medicine

## 2024-03-10 DIAGNOSIS — I10 Essential (primary) hypertension: Secondary | ICD-10-CM

## 2024-03-14 ENCOUNTER — Other Ambulatory Visit: Payer: Self-pay | Admitting: Medical Genetics

## 2024-03-21 ENCOUNTER — Encounter: Payer: Self-pay | Admitting: Family Medicine

## 2024-03-21 ENCOUNTER — Other Ambulatory Visit
Admission: RE | Admit: 2024-03-21 | Discharge: 2024-03-21 | Disposition: A | Discharge status: Home / Self Care | Payer: Self-pay | Source: Ambulatory Visit | Attending: Medical Genetics | Admitting: Medical Genetics

## 2024-03-21 ENCOUNTER — Ambulatory Visit: Admitting: Family Medicine

## 2024-03-21 VITALS — BP 114/83 | HR 71 | Ht 66.0 in | Wt 225.5 lb

## 2024-03-21 DIAGNOSIS — R42 Dizziness and giddiness: Secondary | ICD-10-CM | POA: Diagnosis not present

## 2024-03-21 DIAGNOSIS — Z720 Tobacco use: Secondary | ICD-10-CM

## 2024-03-21 DIAGNOSIS — L409 Psoriasis, unspecified: Secondary | ICD-10-CM | POA: Insufficient documentation

## 2024-03-21 DIAGNOSIS — M2550 Pain in unspecified joint: Secondary | ICD-10-CM

## 2024-03-21 DIAGNOSIS — F32A Depression, unspecified: Secondary | ICD-10-CM

## 2024-03-21 DIAGNOSIS — F419 Anxiety disorder, unspecified: Secondary | ICD-10-CM

## 2024-03-21 DIAGNOSIS — I10 Essential (primary) hypertension: Secondary | ICD-10-CM | POA: Diagnosis not present

## 2024-03-21 MED ORDER — HYDROCHLOROTHIAZIDE 12.5 MG PO TABS: 12.5000 mg | ORAL_TABLET | Freq: Every day | ORAL | 0 refills | Status: DC

## 2024-03-21 MED ORDER — AMLODIPINE BESYLATE 10 MG PO TABS: 10.0000 mg | ORAL_TABLET | Freq: Every day | ORAL | 0 refills | Status: DC

## 2024-03-21 NOTE — Assessment & Plan Note (Addendum)
 Had recent episode over the weekend which came on without insult. She has not taken meclizine  for this due to it making her too drowsy for work. She has been hydrating well and has been having blood pressure well controlled. She is under a good bit of stress from work and now her kids are home from school for the summer. There is not a clear etiology to her dizziness episodes with nothing seeming to make better or worse. Her episodes usually self resolve after time. -Continue to hydrate well and keep on current BP regimen -Consider referral to neurology if symptoms persist

## 2024-03-21 NOTE — Assessment & Plan Note (Signed)
 Patient had previously been down to 2 cigarettes per day. She has experienced recent life stressors and has gotten up to around 6-8 some days of the week. She is motivated to cut this back down and to quit.  -Continue to work on smoking cessation

## 2024-03-21 NOTE — Assessment & Plan Note (Signed)
 Patient has fading plaques on her hands and feet which were worse and flared up a few months ago. She has a prior flare up in 2021 as well. These flares are associated with some joint pains in her wrists and shoulders. She has seen a dermatologist in the past but has never seen a rheumatologist.  -Ambulatory referral to Rheumatology for psoriatic arthritis workup

## 2024-03-21 NOTE — Progress Notes (Signed)
 Established Patient Office Visit  Subjective   Patient ID: Kendra Patel, female    DOB: 1981/03/02  Age: 43 y.o. MRN: [id number]  Chief Complaint  Patient presents with   Medical Management of Chronic Issues   Anxiety    Pt last seen 02/01/24. She was to start sertraline  50 mg daily. Pt reports she stopped taking after 4 days due to not liking how the medication would make her feel.    Hypertension    Has not monitored at home. Reports was feeling better up until Saturday morning. Reports she had cut back on smoking but has recently increased intake again, as well as busy. Reports taking an extra hydrochlorothiazide  yesterday evening due to feeling really bad. Also reports so shoulder pain that started at the end of May. Also reports still having dizziness   Kendra Patel is a 43 y/o patient with history of anxiety and hypertension presenting today for 6 week follow up. She was prescribed sertraline  6 weeks ago but stopped taking it after 4 days because it made her feel very drowsy and was not helping her sleep. Since then, she feels that her anxiety has been doing okay. She still feels an elevated heartbeat and racing thoughts some nights when she is laying down to sleep. She has been under a good deal of stress lately with her kids off of school for the summer and they are engaged in extracurricular activities. She is not interested in trying a new medication for anxiety at this time. She had been cutting down on fast food and her smoking when we saw her last but because of her stress, she has been eating more fast food and is smoking more than the 2 cigarettes a day that she had been having. She had been trying to walk more but has not been doing so recently.   Her blood pressure has been doing well and is good in the office today but she had an episode of dizziness a few days ago where she feels her blood pressure might have been up. She has not checked her blood pressure during these  episodes to see if it is elevated when she is feeling dizzy. She is a little dizzy in the office today but has not been able to take her meclizine  as it makes her too drowsy to be able to work. She thinks her water intake is good and is urinating normally. Nothing seems to precipitate these episodes as it can happen sitting or standing with no clear triggers.   She does also endorse 2 weeks of shoulder pain that she first noticed at work. 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 The pain is dull and aching in quality and does not radiate; she has no numbness or tingling or weakness. She has been taking combination tylenol -ibuprofen  on occasion which has been helpful for her pain. Of note, she states she was diagnosed with psoriasis by a dermatologist some years back and has had a recent flare up on her right foot and left hand.    Past Medical History:  Diagnosis Date   Anxiety    Arthritis    Depression    Hypertension    Past Surgical History:  Procedure Laterality Date   TUBAL LIGATION N/A 05/09/2015   Procedure: POST PARTUM TUBAL LIGATION;  Surgeon: Teresa Fender, MD;  Location: [facility] ORS;  Service: Gynecology;  Laterality: N/A;      Objective:     BP 114/83 (  BP Location: Left Arm, Patient Position: Sitting, Cuff Size: Large)   Pulse 71   Ht 5\' 6"  (1.676 m)   Wt 225 lb 8 oz (102.3 kg)   SpO2 100%   BMI 36.40 kg/m  BP Readings from Last 3 Encounters:  03/21/24 114/83  02/01/24 113/85  12/10/23 120/88   Wt Readings from Last 3 Encounters:  03/21/24 225 lb 8 oz (102.3 kg)  02/01/24 221 lb (100.2 kg)  12/10/23 217 lb 12.8 oz (98.8 kg)      Physical Exam Constitutional:      General: She is not in acute distress.    Appearance: Normal appearance. She is obese.  HENT:     Head: Normocephalic and atraumatic.     Right Ear: External ear normal.     Left Ear: External ear normal.     Nose: Nose normal.     Mouth/Throat:     Mouth: Mucous membranes are  moist.     Pharynx: Oropharynx is clear. No oropharyngeal exudate.  Eyes:     General: No scleral icterus.    Extraocular Movements: Extraocular movements intact.     Conjunctiva/sclera: Conjunctivae normal.     Pupils: Pupils are equal, round, and reactive to light.  Cardiovascular:     Rate and Rhythm: Normal rate and regular rhythm.     Pulses: Normal pulses.     Heart sounds: Normal heart sounds. No murmur heard.    No friction rub. No gallop.  Pulmonary:     Effort: Pulmonary effort is normal. No respiratory distress.     Breath sounds: Normal breath sounds. No wheezing or rales.  Abdominal:     General: Abdomen is flat. There is no distension.     Tenderness: There is no abdominal tenderness.  Musculoskeletal:        General: No swelling, tenderness or deformity. Normal range of motion.     Cervical back: Normal range of motion.     Comments: No weakness on abduction/adduction flexion/extension/ internal/ external rotation of the right shoulder  Skin:    General: Skin is warm and dry.  Neurological:     General: No focal deficit present.     Mental Status: She is alert and oriented to person, place, and time. Mental status is at baseline.     Cranial Nerves: No cranial nerve deficit.     Motor: No weakness.     Gait: Gait normal.  Psychiatric:        Mood and Affect: Mood normal.        Behavior: Behavior normal.    No results found for any visits on 03/21/24.  Last CBC Lab Results  Component Value Date   WBC 5.0 02/01/2024   HGB 13.2 02/01/2024   HCT 40.1 02/01/2024   MCV 89 02/01/2024   MCH 29.4 02/01/2024   RDW 12.0 02/01/2024   PLT 241 02/01/2024   Last metabolic panel Lab Results  Component Value Date   GLUCOSE 82 02/01/2024   NA 139 02/01/2024   K 3.9 02/01/2024   CL 103 02/01/2024   CO2 24 02/01/2024   BUN 23 02/01/2024   CREATININE 0.72 02/01/2024   EGFR 107 02/01/2024   CALCIUM  9.8 02/01/2024   PROT 7.5 02/01/2024   ALBUMIN 4.3 02/01/2024    LABGLOB 3.2 02/01/2024   BILITOT <0.2 02/01/2024   ALKPHOS 123 (H) 02/01/2024   AST 19 02/01/2024   ALT 19 02/01/2024   ANIONGAP 5 (L) 05/09/2013   Last lipids  Lab Results  Component Value Date   CHOL 176 12/10/2023   HDL 61 12/10/2023   LDLCALC 105 (H) 12/10/2023   TRIG 48 12/10/2023   CHOLHDL 2.9 12/10/2023   Last hemoglobin A1c Lab Results  Component Value Date   HGBA1C 5.6 10/09/2023   Last thyroid functions Lab Results  Component Value Date   TSH 1.650 02/01/2024      The 10-year ASCVD risk score (Arnett DK, et al., 2019) is: 1.4%    Assessment & Plan:   Problem List Items Addressed This Visit       Cardiovascular and Mediastinum   Primary hypertension - Primary   Blood pressure continuing to do well on Amlodipine  and hydrochlorothiazide . Patient is not currently checking her pressures at home but endorses taking her medications at the same time every day. -Continue current medication regimen  -continue lifestyle modifications including smoking cessation and healthy diet.       Relevant Medications   amLODipine  (NORVASC ) 10 MG tablet   hydrochlorothiazide  (HYDRODIURIL ) 12.5 MG tablet     Musculoskeletal and Integument   Psoriasis   Patient has fading plaques on her hands and feet which were worse and flared up a few months ago. She has a prior flare up in 2021 as well. These flares are associated with some joint pains in her wrists and shoulders. She has seen a dermatologist in the past but has never seen a rheumatologist.  -Ambulatory referral to Rheumatology for psoriatic arthritis workup      Relevant Orders   Ambulatory referral to Rheumatology     Other   Tobacco abuse   Patient had previously been down to 2 cigarettes per day. She has experienced recent life stressors and has gotten up to around 6-8 some days of the week. She is motivated to cut this back down and to quit.  -Continue to work on smoking cessation      Anxiety and depression    Patient was prescribed sertraline  6 weeks ago. She took it for 4 days but it made her feel drowsy but not able to sleep well. It made her tired during the day which had made it difficult for her to work so she discontinued it. Since then, she feels her anxiety has been okay and has not gotten any worse and may be a bit better. She feels mainly she was anxious regarding her blood pressure and now that it is under better control, she is also feeling less anxious. She does not wish to try a new anxiety medication at this time but will let us  know if she wishes to in the future. -continue to work on stress management including exercise, diet, and smoking cessation -Feels better after walks so will try to implement more exercise into her routine      Dizziness   Had recent episode over the weekend which came on without insult. She has not taken meclizine  for this due to it making her too drowsy for work. She has been hydrating well and has been having blood pressure well controlled. She is under a good bit of stress from work and now her kids are home from school for the summer. There is not a clear etiology to her dizziness episodes with nothing seeming to make better or worse. Her episodes usually self resolve after time. -Continue to hydrate well and keep on current BP regimen -Consider referral to neurology if symptoms persist       Other Visit Diagnoses  Polyarthralgia       Relevant Orders   Ambulatory referral to Rheumatology       Return in about 3 months (around 06/21/2024) for chronic disease f/u, With PCP.    Monda Angry, Medical Student   Patient seen along with MS3 student, Luther Saltness. I personally evaluated this patient along with the student, and verified all aspects of the history, physical exam, and medical decision making as documented by the student. I agree with the student's documentation and have made all necessary edits.  Markesha Hannig, Stan Eans, MD,  [facility]

## 2024-03-21 NOTE — Assessment & Plan Note (Signed)
 Patient was prescribed sertraline  6 weeks ago. She took it for 4 days but it made her feel drowsy but not able to sleep well. It made her tired during the day which had made it difficult for her to work so she discontinued it. Since then, she feels her anxiety has been okay and has not gotten any worse and may be a bit better. She feels mainly she was anxious regarding her blood pressure and now that it is under better control, she is also feeling less anxious. She does not wish to try a new anxiety medication at this time but will let us  know if she wishes to in the future. -continue to work on stress management including exercise, diet, and smoking cessation -Feels better after walks so will try to implement more exercise into her routine

## 2024-03-21 NOTE — Assessment & Plan Note (Signed)
 Blood pressure continuing to do well on Amlodipine  and hydrochlorothiazide . Patient is not currently checking her pressures at home but endorses taking her medications at the same time every day. -Continue current medication regimen  -continue lifestyle modifications including smoking cessation and healthy diet.

## 2024-04-01 LAB — GENECONNECT MOLECULAR SCREEN: Genetic Analysis Overall Interpretation: NEGATIVE

## 2024-04-08 ENCOUNTER — Ambulatory Visit: Payer: Medicaid Other | Admitting: Family Medicine

## 2024-04-08 ENCOUNTER — Encounter: Payer: Self-pay | Admitting: Family Medicine

## 2024-04-08 VITALS — BP 121/88 | HR 69 | Ht 66.0 in | Wt 230.0 lb

## 2024-04-08 DIAGNOSIS — I10 Essential (primary) hypertension: Secondary | ICD-10-CM

## 2024-04-08 DIAGNOSIS — Z720 Tobacco use: Secondary | ICD-10-CM

## 2024-04-08 DIAGNOSIS — E66812 Obesity, class 2: Secondary | ICD-10-CM | POA: Diagnosis not present

## 2024-04-08 DIAGNOSIS — Z0000 Encounter for general adult medical examination without abnormal findings: Secondary | ICD-10-CM

## 2024-04-08 DIAGNOSIS — E782 Mixed hyperlipidemia: Secondary | ICD-10-CM | POA: Diagnosis not present

## 2024-04-08 DIAGNOSIS — N951 Menopausal and female climacteric states: Secondary | ICD-10-CM

## 2024-04-08 DIAGNOSIS — Z0001 Encounter for general adult medical examination with abnormal findings: Secondary | ICD-10-CM | POA: Diagnosis not present

## 2024-04-08 DIAGNOSIS — L409 Psoriasis, unspecified: Secondary | ICD-10-CM

## 2024-04-08 DIAGNOSIS — S99922A Unspecified injury of left foot, initial encounter: Secondary | ICD-10-CM

## 2024-04-08 DIAGNOSIS — Z1239 Encounter for other screening for malignant neoplasm of breast: Secondary | ICD-10-CM

## 2024-04-08 DIAGNOSIS — R42 Dizziness and giddiness: Secondary | ICD-10-CM

## 2024-04-08 DIAGNOSIS — Z6837 Body mass index (BMI) 37.0-37.9, adult: Secondary | ICD-10-CM

## 2024-04-08 NOTE — Progress Notes (Signed)
 Complete physical exam  Patient: Kendra Patel   DOB: 08-06-1981   43 y.o. Female  MRN: [id number]  Subjective:    Chief Complaint  Patient presents with   Annual Exam    Last completed n/a Diet - General, unhealthy Exercise - swimming once a week Feeling - well Sleeping - well Concerns - Taking 5 mg of amlodipine  for the last week and a half due to dizziness when taking 10 mg. Reports still having some dizziness. Not sure if meclizine  is helping. States its making her more dizzy than anything   Care Management    Tdap - Pneumococcal Vaccines - Hepatitis B Vaccines - HPV Vaccines - Cervical Cancer Screening -     Discussed the use of AI scribe software for clinical note transcription with the patient, who gave verbal consent to proceed. History of Present Illness Kendra Patel is a 43 year old female presents for CPE with dizziness and dietary concerns.  Dizziness: chronic; spinning sensation that occurs in waves, lasting for a week before subsiding and then returning. The dizziness sometimes correlates with her diet, particularly after consuming starchy foods like rice. Physical activity, such as swimming and walking, seems to alleviate the dizziness. No hearing or vision issues, vomiting, or syncope.  She has a history of hypertension and is currently taking amlodipine  5 mg daily, having reduced from 10 mg due to dizziness. She also takes hydrochlorothiazide . She does not regularly monitor her blood pressure at home but notes experiencing dizziness. A previous telehealth consultation resulted in a prescription for a diuretic, which helped with her symptoms.  She has a history of psoriasis, with a flare-up since March 2025. She uses clobetasol ointment, which has improved her symptoms. A previous flare-up in 2021 resolved on its own. There is a family history of psoriasis, with her aunt also affected. She experiences arthritic pain in her hands and shoulder, which she  attributes to her job and lifting her child.  She reports a recent injury to her pinky toe, suspected to be fractured after hitting it on a changing table. The injury occurred on Monday, and while the redness has subsided, her mother noted it still appears swollen. She manages the pain with supportive footwear and over-the-counter analgesics.  She discusses her dietary habits, noting an increase in weight and concerns about prediabetes. She mentions eating a balanced diet but admits to consuming sugar and starchy foods like pasta and pizza. She is participating in a nutrition program to monitor her intake and is scheduled to speak with a nutritionist.  She smokes approximately four to five cigarettes a day, having previously reduced to one cigarette daily. She acknowledges the need to reduce her smoking further.    Most recent fall risk assessment:    04/08/2024   12:18 PM  Fall Risk   Falls in the past year? 0  Number falls in past yr: 0  Injury with Fall? 0  Risk for fall due to : No Fall Risks     Most recent depression screenings:    02/01/2024    8:20 AM 12/10/2023    8:57 AM  PHQ 2/9 Scores  PHQ - 2 Score 3 1  PHQ- 9 Score 10 3    Vision:Not within last year  and Dental: No current dental problems and No regular dental care   Patient Active Problem List   Diagnosis Date Noted   Psoriasis 03/21/2024   Mixed hyperlipidemia 10/30/2023   Left shoulder pain 10/30/2023  Primary hypertension 10/09/2023   Dizziness 10/09/2023   Perimenopausal vasomotor symptoms 10/09/2023   S/P tubal ligation 05/10/2015   Class 2 obesity with body mass index (BMI) of 37.0 to 37.9 in adult 03/14/2015   H/O trichomoniasis 03/14/2015   Sciatic leg pain 03/14/2015   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   Abnormal thyroid screen (blood) 03/14/2015   Tobacco abuse 03/14/2015   Anxiety and depression 03/14/2015   Past Medical History:  Diagnosis Date   Anxiety    Arthritis    Depression     Hypertension    Past Surgical History:  Procedure Laterality Date   TUBAL LIGATION N/A 05/09/2015   Procedure: POST PARTUM TUBAL LIGATION;  Surgeon: Archie Savers, MD;  Location: [facility] ORS;  Service: Gynecology;  Laterality: N/A;   Social History   Tobacco Use   Smoking status: Some Days    Current packs/day: 0.50    Average packs/day: 0.5 packs/day for 27.5 years (13.7 ttl pk-yrs)    Types: Cigarettes    Start date: 1998   Smokeless tobacco: Never  Vaping Use   Vaping status: Every Day  Substance Use Topics   Alcohol use: No   Drug use: Yes    Types: Marijuana    Comment: vape pen   No Known Allergies    Patient Care Team: Wellington Curtis LABOR, FNP as PCP - General (Family Medicine)   Outpatient Medications Prior to Visit  Medication Sig   amLODipine  (NORVASC ) 10 MG tablet Take 1 tablet (10 mg total) by mouth daily. (Patient taking differently: Take 10 mg by mouth daily. Taking 0.5 tablet daily)   hydrochlorothiazide  (HYDRODIURIL ) 12.5 MG tablet Take 1 tablet (12.5 mg total) by mouth daily.   meclizine  (ANTIVERT ) 12.5 MG tablet Take 1 tablet (12.5 mg total) by mouth 3 (three) times daily as needed for dizziness.   No facility-administered medications prior to visit.    Review of Systems  All other systems reviewed and are negative.         Objective:     BP 121/88 (BP Location: Right Arm, Patient Position: Sitting, Cuff Size: Large)   Pulse 69   Ht 5' 6 (1.676 m)   Wt 230 lb (104.3 kg)   LMP 06/06/2023   BMI 37.12 kg/m  BP Readings from Last 3 Encounters:  04/08/24 121/88  03/21/24 114/83  02/01/24 113/85   Wt Readings from Last 3 Encounters:  04/08/24 230 lb (104.3 kg)  03/21/24 225 lb 8 oz (102.3 kg)  02/01/24 221 lb (100.2 kg)     Physical Exam Vitals reviewed.  Constitutional:      General: She is not in acute distress.    Appearance: Normal appearance. She is obese. She is not ill-appearing, toxic-appearing or diaphoretic.  HENT:     Head:  Normocephalic.     Right Ear: Tympanic membrane, ear canal and external ear normal.     Left Ear: Tympanic membrane, ear canal and external ear normal.     Nose: Nose normal. No congestion or rhinorrhea.     Mouth/Throat:     Mouth: Mucous membranes are moist.     Pharynx: Oropharynx is clear. No oropharyngeal exudate or posterior oropharyngeal erythema.   Eyes:     General: Lids are normal. No visual field deficit.       Right eye: No discharge.        Left eye: No discharge.     Extraocular Movements: Extraocular movements intact.     Right eye:  Normal extraocular motion.     Left eye: Normal extraocular motion.     Conjunctiva/sclera: Conjunctivae normal.     Right eye: Right conjunctiva is not injected.     Left eye: Left conjunctiva is not injected.     Pupils: Pupils are equal, round, and reactive to light.   Neck:     Thyroid: No thyroid mass, thyromegaly or thyroid tenderness.     Vascular: No carotid bruit.   Cardiovascular:     Rate and Rhythm: Normal rate and regular rhythm.     Pulses: Normal pulses.          Radial pulses are 2+ on the right side and 2+ on the left side.       Posterior tibial pulses are 2+ on the right side and 2+ on the left side.     Heart sounds: Normal heart sounds, S1 normal and S2 normal. No murmur heard.    No friction rub. No gallop.  Pulmonary:     Effort: Pulmonary effort is normal. No respiratory distress.     Breath sounds: Normal breath sounds. No stridor. No wheezing, rhonchi or rales.  Chest:     Comments: No breast concerns - declined exam Abdominal:     General: Abdomen is protuberant. Bowel sounds are normal. There is no distension.     Palpations: Abdomen is soft. There is no hepatomegaly, splenomegaly or mass.     Tenderness: There is no abdominal tenderness. There is no guarding or rebound.     Hernia: No hernia is present.  Genitourinary:    Comments: Denied GU concerns today, no pap performed per pt's  request.  Musculoskeletal:        General: No swelling or tenderness. Normal range of motion.     Cervical back: Normal range of motion. No rigidity or tenderness.     Right lower leg: No edema.     Left lower leg: No edema.  Feet:     Comments: Left fifth toe, swollen- pt stubbed on Monday, 04/04/24 Lymphadenopathy:     Cervical: No cervical adenopathy.     Right cervical: No superficial, deep or posterior cervical adenopathy.    Left cervical: No superficial, deep or posterior cervical adenopathy.   Skin:    General: Skin is warm and dry.     Capillary Refill: Capillary refill takes less than 2 seconds.     Findings: No bruising or erythema.   Neurological:     General: No focal deficit present.     Mental Status: She is alert and oriented to person, place, and time. Mental status is at baseline.     GCS: GCS eye subscore is 4. GCS verbal subscore is 5. GCS motor subscore is 6.     Cranial Nerves: No cranial nerve deficit, dysarthria or facial asymmetry.     Sensory: No sensory deficit.     Motor: No weakness, tremor or pronator drift.     Coordination: Romberg sign negative. Coordination normal. Finger-Nose-Finger Test normal.     Gait: Gait is intact. Gait normal.   Psychiatric:        Attention and Perception: Attention and perception normal.        Mood and Affect: Mood and affect normal.        Speech: Speech normal.        Behavior: Behavior normal. Behavior is cooperative.        Thought Content: Thought content normal.  Cognition and Memory: Cognition and memory normal.        Judgment: Judgment normal.      No results found for any visits on 04/08/24.     Assessment & Plan:    Routine Health Maintenance and Physical Exam  Assessment and Plan Assessment & Plan Pinky Toe Injury- stubbed toe 04/04/24 Swelling persists, pain alleviated by certain footwear.  Declined X-ray.  Expected to heal unless more severe injury. - Advise icing the toe, elevated,  supportive footwear. - Recommend Tylenol  or ibuprofen  for pain. - Suggest taping the toe to the adjacent toe for support.  Perimenopausal Symptoms - Pt stopped Zoloft - did not like it after 5 days - states hot flashes have decreased - Would like to hold off on any other mgmt at this time - Could be related to intermittent dizziness - lmp August 2024  Psoriasis Flare-up since March, improved with clobetasol. Upcoming rheumatology appointment for possible psoriatic arthritis. - Encourage continued use of clobetasol ointment. - Continue use of emollients to affected areas - Hold off on derm referral - Advise keeping the rheumatology appointment on August 6.  Hypertension On amlodipine  and hydrochlorothiazide . Continues to report intermittent dizziness (chronic), possibly medication or diet-related. DBP slightly elevated today. GOAL>130/80 - Advise monitoring blood pressure at home. - May need to increase amlodipine  if BP not at goal - Schedule follow-up in 3-4 months for chronic disease management.  HLD Continue Crestor  Repeast lipid today  Dizziness Chronic since October 2024 Intermittent dizziness possibly related to diet or medication.  Previous cardiac evaluation normal. - Denies syncope; states  just will feel off, body feels like its spinning - No vision issues, no NVD - doesn't appear to be BPPV - neg dixhaplike  Dietary modifications discussed. - Recheck A1c and CMP - Continue prn Antivert   - Continue adequate water intake - Decrease tobacco use  Obesity - Continue to make conscious decisions for well balanced diet smaller portions with increase protein, fruits, veggies, water as drink of choice, decrease starches, processed foods, and saturated fats. Increase weekly exercise - 150 minutes per week.   - Advise dietary modifications to reduce starchy carbs. - Consider neurology referral if dizziness persists and labs are normal.  Smoking Cessation Increased  smoking with reported wheezing. Importance of reducing smoking discussed. - Encourage reducing smoking back to one cigarette a day.  General Health Maintenance  Possible menopause.- LMP August 2024  Importance of routine screenings discussed. - Decline pap today - Recommend scheduling a mammogram. - Advise seeing a dentist for routine check-up and oral cancer screening. - declined vaccines  today  Things to do to keep yourself healthy  - Exercise at least 30-45 minutes a day, 3-4 days a week.  - Eat a low-fat diet with lots of fruits and vegetables, up to 7-9 servings per day.  - Seatbelts can save your life. Wear them always.  - Smoke detectors on every level of your home, check batteries every year.  - Eye Doctor - have an eye exam every 1-2 years  - Safe sex - if you may be exposed to STDs, use a condom.  - Alcohol -  If you drink, do it moderately, less than 2 drinks per day.  - Health Care Power of Attorney. Choose someone to speak for you if you are not able.  - Depression is common in our stressful world.If you're feeling down or losing interest in things you normally enjoy, please come in for a visit.  - Violence -  If anyone is threatening or hurting you, please call immediately.   Follow-up Multiple ongoing health issues require monitoring and follow-up. - Schedule follow-up appointment in 3-4 months for chronic disease management. - Review lab results and adjust treatment plan as necessary. - Ensure follow-up with rheumatology and potential neurology referrals.   Health Maintenance  Topic Date Due   Pneumococcal Vaccination (1 of 2 - PCV) Never done   Hepatitis B Vaccine (1 of 3 - 19+ 3-dose series) Never done   HPV Vaccine (1 - 3-dose SCDM series) Never done   Pap with HPV screening  Never done   DTaP/Tdap/Td vaccine (3 - Td or Tdap) 10/09/2020   COVID-19 Vaccine (3 - 2024-25 season) 06/14/2023   Flu Shot  05/13/2024   Hepatitis C Screening  Completed   HIV  Screening  Completed   Meningitis B Vaccine  Aged Out    Discussed health benefits of physical activity, and encouraged her to engage in regular exercise appropriate for her age and condition.  Annual physical exam -     Lipid Panel With LDL/HDL Ratio -     Comprehensive metabolic panel with GFR  Primary hypertension -     Comprehensive metabolic panel with GFR  Class 2 obesity due to excess calories without serious comorbidity with body mass index (BMI) of 37.0 to 37.9 in adult -     Hemoglobin A1c  Mixed hyperlipidemia -     Lipid Panel With LDL/HDL Ratio  Injury of toe on left foot, initial encounter  Dizziness  Psoriasis  Tobacco abuse  Encounter for screening breast examination -     3D Screening Mammogram, Left and Right; Future  Perimenopausal vasomotor symptoms     Return in about 3 months (around 07/09/2024).   I, Curtis DELENA Boom, FNP, have reviewed all documentation for this visit. The documentation on 04/08/24 for the exam, diagnosis, procedures, and orders are all accurate and complete.   Curtis DELENA Boom, FNP

## 2024-04-09 LAB — COMPREHENSIVE METABOLIC PANEL WITH GFR
ALT: 12 IU/L (ref 0–32)
AST: 14 IU/L (ref 0–40)
Albumin: 4 g/dL (ref 3.9–4.9)
Alkaline Phosphatase: 111 IU/L (ref 44–121)
BUN/Creatinine Ratio: 15 (ref 9–23)
BUN: 11 mg/dL (ref 6–24)
Bilirubin Total: 0.2 mg/dL (ref 0.0–1.2)
CO2: 21 mmol/L (ref 20–29)
Calcium: 9.3 mg/dL (ref 8.7–10.2)
Chloride: 104 mmol/L (ref 96–106)
Creatinine, Ser: 0.75 mg/dL (ref 0.57–1.00)
Globulin, Total: 3 g/dL (ref 1.5–4.5)
Glucose: 81 mg/dL (ref 70–99)
Potassium: 4 mmol/L (ref 3.5–5.2)
Sodium: 139 mmol/L (ref 134–144)
Total Protein: 7 g/dL (ref 6.0–8.5)
eGFR: 102 mL/min/{1.73_m2} (ref >59)

## 2024-04-09 LAB — HEMOGLOBIN A1C
Est. average glucose Bld gHb Est-mCnc: 108 mg/dL
Hgb A1c MFr Bld: 5.4 % (ref 4.8–5.6)

## 2024-04-09 LAB — LIPID PANEL WITH LDL/HDL RATIO
Cholesterol, Total: 187 mg/dL (ref 100–199)
HDL: 55 mg/dL (ref >39)
LDL Chol Calc (NIH): 117 mg/dL — ABNORMAL HIGH (ref 0–99)
LDL/HDL Ratio: 2.1 ratio (ref 0.0–3.2)
Triglycerides: 82 mg/dL (ref 0–149)
VLDL Cholesterol Cal: 15 mg/dL (ref 5–40)

## 2024-04-11 ENCOUNTER — Ambulatory Visit: Payer: Self-pay | Admitting: Family Medicine

## 2024-06-12 ENCOUNTER — Other Ambulatory Visit: Payer: Self-pay | Admitting: Family Medicine

## 2024-06-12 DIAGNOSIS — I10 Essential (primary) hypertension: Secondary | ICD-10-CM

## 2024-06-16 ENCOUNTER — Other Ambulatory Visit: Payer: Self-pay | Admitting: Family Medicine

## 2024-06-16 DIAGNOSIS — I10 Essential (primary) hypertension: Secondary | ICD-10-CM

## 2024-07-07 ENCOUNTER — Ambulatory Visit: Admitting: Family Medicine

## 2024-07-07 ENCOUNTER — Encounter: Payer: Self-pay | Admitting: Family Medicine

## 2024-07-07 VITALS — BP 116/89 | HR 71 | Temp 98.0°F | Ht 66.0 in | Wt 218.9 lb

## 2024-07-07 DIAGNOSIS — Z720 Tobacco use: Secondary | ICD-10-CM | POA: Diagnosis not present

## 2024-07-07 DIAGNOSIS — R42 Dizziness and giddiness: Secondary | ICD-10-CM | POA: Diagnosis not present

## 2024-07-07 DIAGNOSIS — I10 Essential (primary) hypertension: Secondary | ICD-10-CM | POA: Diagnosis not present

## 2024-07-07 MED ORDER — HYDROCHLOROTHIAZIDE 25 MG PO TABS: 25.0000 mg | ORAL_TABLET | Freq: Every day | ORAL | 3 refills | Status: AC

## 2024-07-07 NOTE — Progress Notes (Signed)
 Established Patient Office Visit  Introduced to nurse practitioner role and practice setting.  All questions answered.  Discussed provider/patient relationship and expectations.   Subjective   Patient ID: Kendra Patel, female    DOB: [birth year]  Age: 43 y.o. MRN: [id number]  Chief Complaint  Patient presents with   Hypertension    -Follow up on blood pressure -No exercising, following low salt diet -Monitors blood pressure at home, some days are better than others  Vaccines declined for now.  Patient is currently not under OB practice to get paps.    Discussed the use of AI scribe software for clinical note transcription with the patient, who gave verbal consent to proceed.  History of Present Illness Kendra Patel is a 43 year old female with hypertension who presents with ongoing dizziness and elevated diastolic blood pressure.  She has experienced dizziness for nearly a year, beginning after an episode of elevated blood pressure in November of the previous year. The dizziness is described as feeling 'out of it' or 'like I'm in a dream,' occurring once a day or a few times a week. There is no association with palpitations, diaphoresis, or syncope.  She monitors her blood pressure at home, noting elevated diastolic readings ranging from 83 to 94 mmHg. She is currently on hydrochlorothiazide  and amlodipine  for hypertension. Taking her medication later in the day sometimes improves her symptoms.  She is working with a nutritionist through a [employer], recording her blood pressure and dietary intake. Increasing her protein intake seems to help with her symptoms. She has lost about 15 pounds over the past year and has made dietary changes, such as reducing bread and sugar intake.  She continues to smoke cigarettes, although she has reduced her consumption. She feels that her dizziness may be related to stress levels, as she feels better when more relaxed. She has not been  using meclizine  recently, as dietary adjustments seem to help more.  Her past medical history includes a normal results from Zio patch cardiac monitor and blood work that ruled out prediabetes.       07/07/2024    8:56 AM 02/01/2024    8:20 AM 12/10/2023    8:57 AM  Depression screen PHQ 2/9  Decreased Interest 0 1 0  Down, Depressed, Hopeless 0 2 1  PHQ - 2 Score 0 3 1  Altered sleeping 0 2 0  Tired, decreased energy 0 3 1  Change in appetite 0 1 0  Feeling bad or failure about yourself  0 1 1  Trouble concentrating 0 0 0  Moving slowly or fidgety/restless 0 0 0  Suicidal thoughts 0 0 0  PHQ-9 Score 0 10 3  Difficult doing work/chores Not difficult at all Somewhat difficult Not difficult at all       07/07/2024    8:56 AM 02/01/2024    8:21 AM 12/10/2023    8:58 AM 10/30/2023    8:48 AM  GAD 7 : Generalized Anxiety Score  Nervous, Anxious, on Edge 0 3 1 0  Control/stop worrying 0 3 1 1   Worry too much - different things 0 3 1 1   Trouble relaxing 0 2 0 0  Restless 0 1 0 0  Easily annoyed or irritable 0 3 1 1   Afraid - awful might happen 1 1 2 2   Total GAD 7 Score 1 16 6 5   Anxiety Difficulty Not difficult at all Somewhat difficult Not difficult at all Not difficult at all  ROS  Negative unless indicated in HPI   Objective:     BP 116/89 (BP Location: Left Arm, Patient Position: Sitting, Cuff Size: Normal)   Pulse 71   Temp 98 F (36.7 C) (Oral)   Ht 5' 6 (1.676 m)   Wt 218 lb 14.4 oz (99.3 kg)   SpO2 100%   BMI 35.33 kg/m    Physical Exam Constitutional:      General: She is not in acute distress.    Appearance: Normal appearance. She is obese. She is not ill-appearing, toxic-appearing or diaphoretic.  HENT:     Head: Normocephalic.     Nose: Nose normal.     Mouth/Throat:     Mouth: Mucous membranes are moist.     Pharynx: Oropharynx is clear.  Eyes:     Extraocular Movements: Extraocular movements intact.     Conjunctiva/sclera: Conjunctivae  normal.     Pupils: Pupils are equal, round, and reactive to light.  Neck:     Vascular: No carotid bruit.  Cardiovascular:     Rate and Rhythm: Normal rate and regular rhythm.     Pulses: Normal pulses.     Heart sounds: Normal heart sounds. No murmur heard.    No friction rub. No gallop.  Pulmonary:     Effort: No respiratory distress.     Breath sounds: No stridor. No wheezing, rhonchi or rales.  Chest:     Chest wall: No tenderness.  Musculoskeletal:     Cervical back: No rigidity or tenderness.     Right lower leg: No edema.     Left lower leg: No edema.  Lymphadenopathy:     Cervical: No cervical adenopathy.  Skin:    General: Skin is warm and dry.     Capillary Refill: Capillary refill takes less than 2 seconds.  Neurological:     General: No focal deficit present.     Mental Status: She is alert and oriented to person, place, and time. Mental status is at baseline.     Cranial Nerves: No cranial nerve deficit.     Sensory: No sensory deficit.     Motor: No weakness.     Gait: Gait normal.  Psychiatric:        Mood and Affect: Mood normal.        Behavior: Behavior normal.        Thought Content: Thought content normal.        Judgment: Judgment normal.      No results found for any visits on 07/07/24.    The 10-year ASCVD risk score (Arnett DK, et al., 2019) is: 2%    Assessment & Plan:  Primary hypertension -     hydroCHLOROthiazide ; Take 1 tablet (25 mg total) by mouth daily.  Dispense: 90 tablet; Refill: 3  Dizziness -     Ambulatory referral to Neurology  Tobacco abuse     Assessment and Plan Assessment & Plan Hypertension Persistent elevation in diastolic blood pressure with recent readings between 83 and 94 mmHg. Current treatment includes hydrochlorothiazide  and amlodipine . - Discussed Increasing hydrochlorothiazide  to 25 mg if diastolic pressure remains mid 19d or above after one week of home monitoring. - Continue Amlodipine  10 mg  daily - Continue daily blood pressure monitoring at the same time each day. - Maintain low sodium diet and consider increasing physical activity. - Consider adding an ARB - Recommend checking BP when pt is feeling dizziness, orthostatic negative in past  Chronic dizziness Chronic dizziness  persisting for nearly a year, occurring a few times a week.  - Initially thought to be BP related. Orthostatics have been negative, Zip patch unremarkable. Denies chest pain, palpitations, SOB, DOB. Dixhalpike neg. Meclizine  has been taken in past, but dizziness not postural in nature.  - Denies syncope -  Dizziness may be related to blood pressure fluctuations vs neuro vs ENT.  - Pt feels Dizziness may be influenced by stress levels and dietary protein intake, A1C - normal  - Given chronic, as this time referring to a specialist for further evaluation on Dizziness cause.  - Refer to neurology for further evaluation to rule out neurological causes, will hold on cardiac as ZIO patch normal, may consider ECHO or carotid US   - Monitor blood pressure and symptoms closely. - Consider cardiology and ENT referrals if neurology evaluation is inconclusive.  Tobacco Abuse - Continue efforts to decrease cigarettes use - doing great down to one to a few per day.   Return in about 5 weeks (around 08/11/2024).   I, Curtis DELENA Boom, FNP, have reviewed all documentation for this visit. The documentation on 07/07/24 for the exam, diagnosis, procedures, and orders are all accurate and complete.   Curtis DELENA Boom, FNP

## 2024-08-11 ENCOUNTER — Telehealth: Payer: Self-pay

## 2024-08-11 ENCOUNTER — Encounter: Payer: Self-pay | Admitting: Family Medicine

## 2024-08-11 ENCOUNTER — Ambulatory Visit: Admitting: Family Medicine

## 2024-08-11 VITALS — BP 138/92 | HR 90 | Resp 16 | Ht 66.0 in | Wt 222.8 lb

## 2024-08-11 DIAGNOSIS — E782 Mixed hyperlipidemia: Secondary | ICD-10-CM | POA: Diagnosis not present

## 2024-08-11 DIAGNOSIS — I10 Essential (primary) hypertension: Secondary | ICD-10-CM | POA: Diagnosis not present

## 2024-08-11 DIAGNOSIS — Z720 Tobacco use: Secondary | ICD-10-CM

## 2024-08-11 NOTE — Progress Notes (Signed)
 Established Patient Office Visit  Introduced to nurse practitioner role and practice setting.  All questions answered.  Discussed provider/patient relationship and expectations.  Subjective   Patient ID: Kendra Patel, female    DOB: 02-06-1981  Age: 43 y.o. MRN: [id number]  Chief Complaint  Patient presents with   Medical Management of Chronic Issues    HTN and Dizziness Patient reports that her dizziness is much better after starting electrolytes. Blood Pressure reading at home fluctuates . This morning it was 140/100    Discussed the use of AI scribe software for clinical note transcription with the patient, who gave verbal consent to proceed.  History of Present Illness Kendra Patel is a 43 year old female with hypertension who presents with concerns about blood pressure control.  She has been experiencing fluctuations in her blood pressure despite adherence to her medication regimen. She takes hydrochlorothiazide  25 mg every morning at 7:30 AM. She reports that she starts to feel the effects of the medication around 11:30 AM, feeling 'wavy' for about an hour before feeling better. She feels 'wavy' for about an hour before feeling better. Her blood pressure readings have varied, with the highest being 140/100 mmHg and the best being 135/84 mmHg. She checks her blood pressure two to three times a week.  In addition to hydrochlorothiazide , she is on amlodipine . She experiences dizziness, which she attributes to possible electrolyte imbalance. To address this, she drinks Gatorade and coconut water on weekends, which has improved her symptoms. She reports increased urination and feels better after drinking water in the morning.  Her blood pressure has been historically high, with previous readings in the 170s. She has been aware of her high blood pressure since her teenage years. She has reduced her smoking to three cigarettes a day and is making lifestyle changes, including dietary  adjustments. She notes that consuming high-sodium foods like pizza and bread may exacerbate her symptoms.         07/07/2024    8:56 AM 02/01/2024    8:20 AM 12/10/2023    8:57 AM  Depression screen PHQ 2/9  Decreased Interest 0 1 0  Down, Depressed, Hopeless 0 2 1  PHQ - 2 Score 0 3 1  Altered sleeping 0 2 0  Tired, decreased energy 0 3 1  Change in appetite 0 1 0  Feeling bad or failure about yourself  0 1 1  Trouble concentrating 0 0 0  Moving slowly or fidgety/restless 0 0 0  Suicidal thoughts 0 0 0  PHQ-9 Score 0 10 3  Difficult doing work/chores Not difficult at all Somewhat difficult Not difficult at all       07/07/2024    8:56 AM 02/01/2024    8:21 AM 12/10/2023    8:58 AM 10/30/2023    8:48 AM  GAD 7 : Generalized Anxiety Score  Nervous, Anxious, on Edge 0 3 1 0  Control/stop worrying 0 3 1 1   Worry too much - different things 0 3 1 1   Trouble relaxing 0 2 0 0  Restless 0 1 0 0  Easily annoyed or irritable 0 3 1 1   Afraid - awful might happen 1 1 2 2   Total GAD 7 Score 1 16 6 5   Anxiety Difficulty Not difficult at all Somewhat difficult Not difficult at all Not difficult at all     ROS  Negative unless indicated in HPI   Objective:     BP (!) 149/100 (BP Location:  Left Arm, Patient Position: Sitting, Cuff Size: Normal)   Pulse 90   Resp 16   Ht 5' 6 (1.676 m)   Wt 222 lb 12.8 oz (101.1 kg)   SpO2 99%   BMI 35.96 kg/m    Physical Exam Constitutional:      General: She is not in acute distress.    Appearance: Normal appearance. She is obese. She is not ill-appearing, toxic-appearing or diaphoretic.  HENT:     Head: Normocephalic.     Nose: Nose normal.     Mouth/Throat:     Mouth: Mucous membranes are moist.     Pharynx: Oropharynx is clear.  Eyes:     Extraocular Movements: Extraocular movements intact.     Pupils: Pupils are equal, round, and reactive to light.  Cardiovascular:     Rate and Rhythm: Normal rate and regular rhythm.      Pulses: Normal pulses.     Heart sounds: Normal heart sounds. No murmur heard.    No friction rub. No gallop.  Pulmonary:     Effort: No respiratory distress.     Breath sounds: No stridor. No wheezing, rhonchi or rales.  Chest:     Chest wall: No tenderness.  Musculoskeletal:     Right lower leg: No edema.     Left lower leg: No edema.  Skin:    General: Skin is warm and dry.     Capillary Refill: Capillary refill takes less than 2 seconds.  Neurological:     General: No focal deficit present.     Mental Status: She is alert and oriented to person, place, and time. Mental status is at baseline.  Psychiatric:        Attention and Perception: Attention and perception normal.        Mood and Affect: Mood normal. Affect is tearful.        Behavior: Behavior normal.        Thought Content: Thought content normal.        Judgment: Judgment normal.      No results found for any visits on 08/11/24.    The 10-year ASCVD risk score (Arnett DK, et al., 2019) is: 7.2%    Assessment & Plan:  Primary hypertension -     AMB Referral [facility] Care Management  Mixed hyperlipidemia  Tobacco abuse     Assessment and Plan Assessment & Plan Hypertension Fluctuating blood pressure readings. Current regimen includes amlodipine  and hydrochlorothiazide . Blood pressure readings have been variable, with systolic readings generally well-controlled but diastolic readings elevated. Home readings 120s-150s/80s-90s Symptoms of dizziness and fatigue possibly related to medication timing and electrolyte imbalance, but dizziness much improved. Lifestyle modifications include reduced smoking and dietary changes. Consideration of adding ARB if blood pressure remains uncontrolled.  - Continue current medications: amlodipine  10mg  daily and hydrochlorothiazide  25mg  daily. - Referral to [facility] pharmacy for assistance on options and control - pt is very defeated, and hesitant to start third medication. - Consider  dietary modifications to reduce sodium intake and increase hydration. - continue efforts on exercise - Follow up in 2-3 months to reassess blood pressure control.  Hyperlipidemia - chronic - previous on statin - poor tolerance, myalgias, self stopped - declined medication mgmt - prefers lifestyle at this time  The 10-year ASCVD risk score (Arnett DK, et al., 2019) is: 7.2%   Values used to calculate the score:     Age: 53 years     Clincally relevant sex: Female  Is Non-Hispanic African American: Yes     Diabetic: No     Tobacco smoker: Yes     Systolic Blood Pressure: 149 mmHg     Is BP treated: Yes     HDL Cholesterol: 55 mg/dL     Total Cholesterol: 187 mg/dL   Tobacco Abuse - continue efforts on cessation - keep up the great work!  General Health Maintenance Engaged in lifestyle modifications including reduced smoking and dietary changes. Discussed the importance of maintaining a balanced diet and regular exercise to support overall health and blood pressure management. - Continue lifestyle modifications including reduced smoking and healthy diet. - Encouraged regular physical activity.   Return in about 3 months (around 11/11/2024) for Establish with new PCP.   I, Curtis DELENA Boom, FNP, have reviewed all documentation for this visit. The documentation on 08/11/24 for the exam, diagnosis, procedures, and orders are all accurate and complete.   Curtis DELENA Boom, FNP

## 2024-08-11 NOTE — Progress Notes (Signed)
 Care Guide Pharmacy Note  08/11/2024 Name: Kendra Patel MRN: [id number] DOB: Aug 14, 1981  Referred By: Wellington Curtis LABOR, FNP Reason for referral: Complex Care Management and Call Attempt #1 (Unsuccessful initial outreach to schedule with PHARM D- Allyson)   Kendra Patel is a 42 y.o. year old female who is a primary care patient of Wellington Curtis LABOR, FNP.  Kendra Patel was referred to the pharmacist for assistance related to: HTN  An unsuccessful telephone outreach was attempted today to contact the patient who was referred to the pharmacy team for assistance with Disease management. Additional attempts will be made to contact the patient.  Leotis Rase [facility], [facility] Guide  Direct Dial: [phone]  Fax [phone]

## 2024-08-16 NOTE — Progress Notes (Signed)
 Care Guide Pharmacy Note  08/16/2024 Name: SHARDAY MICHL MRN: [id number] DOB: [birth year]  Referred By: Wellington Curtis LABOR, FNP Reason for referral: Complex Care Management, Call Attempt #1 (Unsuccessful initial outreach to schedule with PHARM DGLENWOOD Keeling), Call Attempt #2 (Unsuccessful initial outreach to schedule with PHARM D- Allyson), and Call Attempt #3 (Unsuccessful initial outreach to schedule with PHARM D-Allyson)   Lynne C Calica is a 43 y.o. year old female who is a primary care patient of Wellington Curtis LABOR, FNP.  Amaani C Vandruff was referred to the pharmacist for assistance related to: HTN  A third unsuccessful telephone outreach was attempted today to contact the patient who was referred to the pharmacy team for assistance with Disease Management. The Population Health team is pleased to engage with this patient at any time in the future upon receipt of referral and should he/she be interested in assistance from the [facility] team.  Leotis Rase [facility]  [facility], [facility] Guide  Direct Dial: [phone]  Fax [phone]

## 2024-11-22 ENCOUNTER — Encounter: Admitting: Family Medicine
